# Patient Record
Sex: Male | Born: 1970 | Race: Black or African American | Hispanic: No | Marital: Single | Smoking: Current every day smoker
Health system: Southern US, Community
[De-identification: ages and names within clinical notes are randomized; demographics above are authoritative.]

## PROBLEM LIST (undated history)

## (undated) DIAGNOSIS — E785 Hyperlipidemia, unspecified: Secondary | ICD-10-CM

## (undated) DIAGNOSIS — B009 Herpesviral infection, unspecified: Secondary | ICD-10-CM

## (undated) DIAGNOSIS — L509 Urticaria, unspecified: Secondary | ICD-10-CM

## (undated) DIAGNOSIS — I10 Essential (primary) hypertension: Secondary | ICD-10-CM

## (undated) HISTORY — DX: Urticaria, unspecified: L50.9

## (undated) HISTORY — DX: Hyperlipidemia, unspecified: E78.5

## (undated) HISTORY — DX: Herpesviral infection, unspecified: B00.9

## (undated) HISTORY — PX: APPENDECTOMY: SHX54

## (undated) HISTORY — DX: Essential (primary) hypertension: I10

---

## 1998-10-15 ENCOUNTER — Emergency Department (HOSPITAL_COMMUNITY): Admission: EM | Admit: 1998-10-15 | Discharge: 1998-10-15 | Payer: Self-pay

## 2000-03-31 ENCOUNTER — Emergency Department (HOSPITAL_COMMUNITY): Admission: EM | Admit: 2000-03-31 | Discharge: 2000-03-31 | Payer: Self-pay | Admitting: Emergency Medicine

## 2000-03-31 ENCOUNTER — Encounter: Payer: Self-pay | Admitting: Emergency Medicine

## 2002-06-23 ENCOUNTER — Emergency Department (HOSPITAL_COMMUNITY): Admission: EM | Admit: 2002-06-23 | Discharge: 2002-06-23 | Payer: Self-pay | Admitting: Emergency Medicine

## 2002-08-25 ENCOUNTER — Emergency Department (HOSPITAL_COMMUNITY): Admission: EM | Admit: 2002-08-25 | Discharge: 2002-08-25 | Payer: Self-pay | Admitting: Emergency Medicine

## 2004-01-12 ENCOUNTER — Ambulatory Visit (HOSPITAL_COMMUNITY): Admission: EM | Admit: 2004-01-12 | Discharge: 2004-01-12 | Payer: Self-pay | Admitting: Emergency Medicine

## 2006-10-12 ENCOUNTER — Emergency Department (HOSPITAL_COMMUNITY): Admission: EM | Admit: 2006-10-12 | Discharge: 2006-10-12 | Payer: Self-pay | Admitting: Emergency Medicine

## 2007-11-15 ENCOUNTER — Ambulatory Visit (HOSPITAL_BASED_OUTPATIENT_CLINIC_OR_DEPARTMENT_OTHER): Admission: RE | Admit: 2007-11-15 | Discharge: 2007-11-15 | Payer: Self-pay | Admitting: Orthopaedic Surgery

## 2008-02-14 HISTORY — PX: ANKLE SURGERY: SHX546

## 2008-11-08 ENCOUNTER — Encounter: Admission: RE | Admit: 2008-11-08 | Discharge: 2008-11-08 | Payer: Self-pay | Admitting: Family Medicine

## 2008-11-08 ENCOUNTER — Encounter: Payer: Self-pay | Admitting: Internal Medicine

## 2008-12-20 ENCOUNTER — Ambulatory Visit: Payer: Self-pay | Admitting: Internal Medicine

## 2008-12-20 DIAGNOSIS — I10 Essential (primary) hypertension: Secondary | ICD-10-CM | POA: Insufficient documentation

## 2008-12-20 DIAGNOSIS — J984 Other disorders of lung: Secondary | ICD-10-CM

## 2008-12-26 ENCOUNTER — Ambulatory Visit (HOSPITAL_COMMUNITY): Admission: RE | Admit: 2008-12-26 | Discharge: 2008-12-26 | Payer: Self-pay | Admitting: Internal Medicine

## 2009-01-04 ENCOUNTER — Ambulatory Visit: Payer: Self-pay | Admitting: Internal Medicine

## 2009-01-14 ENCOUNTER — Ambulatory Visit: Payer: Self-pay | Admitting: Internal Medicine

## 2009-07-17 ENCOUNTER — Telehealth: Payer: Self-pay | Admitting: Internal Medicine

## 2010-09-28 ENCOUNTER — Encounter: Payer: Self-pay | Admitting: Internal Medicine

## 2011-01-20 NOTE — Op Note (Signed)
NAMECARLESS, SLATTEN NO.:  [id number]   MEDICAL RECORD NO.:  [id number]          PATIENT TYPE:  AMB   LOCATION:  DSC                          FACILITY:  [facility]   PHYSICIAN:  Lubertha Basque. Dalldorf, M.D.DATE OF BIRTH:  May 21, 1971   DATE OF PROCEDURE:  11/15/2007  DATE OF DISCHARGE:                               OPERATIVE REPORT   PREOPERATIVE DIAGNOSES:  1. Right ankle retained hardware.  2. Left great ingrown toenail.   POSTOPERATIVE DIAGNOSES:  1. Right ankle retained hardware.  2. Left great ingrown toenail.   PROCEDURES:  1. Right ankle medial and lateral hardware removal.  2. Left great toe nail partial ablation, medial and lateral.   ANESTHESIA:  General.   ATTENDING SURGEON:  Lubertha Basque. Jerl Santos, M.D.   ASSISTANT:  Lindwood Qua, P.A.   INDICATIONS FOR PROCEDURE:  The patient is a 40 year old man about 5  years from ankle ORIF on the right after a horse related accident.  This  has healed well but he has been left with some prominent hardware which  bothers him on the medial and lateral aspects.  He wishes to have this  removed.  He also has a significantly ingrown toenail on the opposite  foot and he wishes to have this addressed with partial medial and  lateral nailbed ablation.  He has had this trimmed in the past and is  actually status post successful right great toenail ablation also  partial in nature.  Informed operative consent was obtained after  discussion of possible complications of reaction to anesthesia,  infection, repeat fracture on the right and repeat ingrown toenail on  the left.   SUMMARY OF FINDINGS AND PROCEDURE:  Under general anesthesia through two  separate incisions, we removed hardware from the right ankle.  On the  medial aspect, we removed two malleolar screws.  On the lateral aspect  removed six screws and the side plate.  Fluoroscopy was used to confirm  removal of all hardware and I read these views myself.  On the  opposite  foot we performed medial and lateral aspect partial nailbed ablation  with removal of the nail.  We used phenol  to hopefully permanently  eliminate the medial and lateral aspects of the nail leaving the central  portion intact.   DESCRIPTION OF PROCEDURE:  The patient was taken to the operating suite  where general anesthetic applied without difficulty.  He was positioned  supine and prepped and draped in normal sterile fashion.  After  administration of IV Kefzol the right leg was elevated, exsanguinated,  tourniquet inflated about the calf.  We made two small incisions on the  medial aspect over his palpable screw heads.  We dissected down to the  screws and removed them with a small fragment set screwdriver.  On the  lateral aspect I used most of his old lateral incision with dissection  down to the plate.  I removed six screws along with the side plate.  Fluoroscopy was then used to confirm adequate removal all hardware.  Wounds were irrigated and reapproximated deep tissues with 2-0  undyed  Vicryl and skin with staples.  Some Marcaine was injected about both  incision sites followed by Adaptic and dry gauze dressing with a  posterior splint of plaster with ankle in neutral position.  Prior to  placement of the plaster splint on the right and while the field was  still sterile, we addressed his left great toenail.  I used a Freer and  dissected down medial lateral aspects where he was significantly  ingrown.  We then took some scissors and removed the medial and lateral  borders of the nail up through the nailbed.  I used a hemostat to abrade  the nailbed both medial and lateral and then used phenol on a Q-tip on  both aspects for a couple of minutes to hopefully perform permanent  nailbed ablation medial and lateral leaving 60 or 70% of the nail  centrally intact.  Some Xeroform and Adaptic were applied followed by  dry gauze and loose wrap.  Estimated blood loss and  intraoperative  fluids can be obtained from anesthesia records as can accurate  tourniquet time on the right.   DISPOSITION:  The patient was extubated in the operating room and taken  to recovery in stable addition.  He was to go home the same day and  follow up in the office in less than a week.  I will contact him by  phone tonight.      Lubertha Basque Jerl Santos, M.D.  Electronically Signed     PGD/MEDQ  D:  11/15/2007  T:  11/16/2007  Job:  [id number]

## 2011-01-23 NOTE — Op Note (Signed)
NAME:  Martin [name], Martin [name]                          ACCOUNT NO.:  [id number]   MEDICAL RECORD NO.:  [id number]                   PATIENT TYPE:  EMS   LOCATION:  ED                                   FACILITY:  [facility]   PHYSICIAN:  Lubertha Basque. Jerl Santos, M.D.             DATE OF BIRTH:  29-Dec-1970   DATE OF PROCEDURE:  01/12/2004  DATE OF DISCHARGE:                                 OPERATIVE REPORT   PREOPERATIVE DIAGNOSIS:  Right ankle fracture.   POSTOPERATIVE DIAGNOSIS:  Right ankle fracture.   PROCEDURE:  ORIF right ankle trimalleolar fracture.   ANESTHESIA:  General.   ATTENDING SURGEON:  Lubertha Basque. Jerl Santos, M.D.   ASSISTANT:  Prince Rome, P.A.   INDICATION FOR PROCEDURE:  The patient is a 40 year old man, who suffered a  horse injury this afternoon.  He was left with a trimalleolar ankle fracture  with a fairly high fibula Weber C variety.  He was offered operative  intervention in hopes of realigning his joint in a more anatomic fashion and  minimizing the chance of ankle trouble in the future.  Informed operative  consent was obtained after discussion of possible complications of reaction  to anesthesia, infection, ankle stiffness, and need for more surgery.  Possibly neurovascular compromise was also discussed.   DESCRIPTION OF PROCEDURE:  The patient was taken to the operating suite  where general anesthetic was applied without difficulty.  He was positioned  supine with a bump under the right hip and prepped and draped in normal  sterile fashion.  After the administration of preop IV antibiotics, the  right leg was elevated, exsanguinated, and a tourniquet inflated about the  upper calf.  A longitudinal incision was made laterally over the fibula  centered with the fracture site.  Dissection was carried down to this  fracture site which was fairly difficult to reduce.  I then approached the  medial aspect, again through a longitudinal incision centered over the  fracture  site.  I was able to reduce this in an anatomic fashion after  retrieving soft tissues from the fracture site and lipping the fragment  about 90 degrees.  We then stabilized it with 2 malleolar screws which were  partially threaded cancellous screws __________ 45 mm in length both.  Fluoroscopy was used to confirm adequate placement of this hardware.  The  fibula then reduced in an easier fashion.  This was then stabilized with a  DCP plate, 6 hole variety with all 6 holes filled with fully threaded  cancellous screws, achieving bicortical purchase of each.  We placed the  compression through the fracture site with a DCP plate.  Fluoroscopy was  again used to confirm adequate placement of hardware and reduction of the  fractures.  I then examined the ankle fluoroscopically to test the integrity  of the syndesmosis.  The syndesmosis seemed to be intact when stressed in  both  directions.  As a result, no syndesmotic screw was felt to be  necessary.  The wounds were irrigated followed by reapproximation of  subcutaneous tissues with 0 and 2-0 undyed Vicryl and skin with staples.  Marcaine was injected about the incision site.  The tourniquet was deflated,  and the foot became pink and warm immediately.  Adaptic was placed over the  wounds followed by dry gauze and a posterior splint of plaster with the  ankle in neutral position.  Estimated blood loss and intraoperative fluids  as well as accurate tourniquet time can be obtained from the anesthesia  records.   DISPOSITION:  The patient was extubated in the operating room and taken to  the recovery room in stable condition.  Plans were for him to go home same  day and to follow up in the office in less than a week.  I will try to  contact him tomorrow.                                               Lubertha Basque Jerl Santos, M.D.    PGD/MEDQ  D:  01/12/2004  T:  01/13/2004  Job:  [id number]

## 2011-01-23 NOTE — Letter (Signed)
July 17, 2009    Sanjuan Dame  [address]  [place], [place]  [address]   RE:  KAIYU, MIRABAL  MRN:  [id number]  /  DOB:  04/25/1971   Dear Mr. Demir:   I understand that you had cancelled your followup CT scan of the chest  and your followup office visit here at [facility].  I had  reviewed your previous x-rays and related studies with you when you were  here on Jan 14, 2009.  These had shown lung nodules with no way to  guarantee that there is not a cancer.  Hopefully, these are innocent  scars, but we have no way of knowing unless we can watch them over time  to see if they grow.  I would be happy to discuss your situation again,  but hope you will get yourself followed up somewhere else if not here.   Please let me know if I can explain or discuss options with you.    Sincerely,      Clinton D. Maple [name], MD, Tonny Bollman, FACP  Electronically Signed    CDY/MedQ  DD: 07/17/2009  DT: 07/18/2009  Job #: [id number]

## 2011-10-13 ENCOUNTER — Other Ambulatory Visit: Payer: Self-pay | Admitting: Family Medicine

## 2011-10-15 NOTE — Telephone Encounter (Signed)
PLEASE PULL CHART AND PUT IN REFILL REQUEST. THANKS

## 2011-10-16 ENCOUNTER — Ambulatory Visit (INDEPENDENT_AMBULATORY_CARE_PROVIDER_SITE_OTHER): Payer: BC Managed Care – PPO | Admitting: Emergency Medicine

## 2011-10-16 DIAGNOSIS — R911 Solitary pulmonary nodule: Secondary | ICD-10-CM

## 2011-10-16 DIAGNOSIS — B009 Herpesviral infection, unspecified: Secondary | ICD-10-CM

## 2011-10-16 DIAGNOSIS — J984 Other disorders of lung: Secondary | ICD-10-CM

## 2011-10-16 DIAGNOSIS — A6001 Herpesviral infection of penis: Secondary | ICD-10-CM

## 2011-10-16 MED ORDER — VALACYCLOVIR HCL 1 G PO TABS: 1000.0000 mg | ORAL_TABLET | Freq: Every day | ORAL | Status: DC

## 2011-10-16 NOTE — Progress Notes (Signed)
  Subjective:    Patient ID: Martin [name], male    DOB: 1971-02-27, 41 y.o.   MRN: [id number]  HPI patient enters for refill of his Valtrex. He is doing well since starting this medication as a preventative. His had no outbreak since starting it.    Review of Systems he has no other complaints at this time     Objective:   Physical Exam  No active lesions at this time      Assessment & Plan:  We'll refill his medications for Valtrex 1 g to take one a day prophylactically. We did discuss also that he has a history of a pulmonary nodule that was followed by Dr. Maple [name] and he has not had a followup visit for 3 years. He is agreeable to make an appointment to see him for followup

## 2011-10-16 NOTE — Patient Instructions (Addendum)
Please make appt with dr. Trixie Dredge Herpes Genital herpes is a sexually transmitted disease. This means that it is a disease passed by having sex with an infected person. There is no cure for genital herpes. The time between attacks can be months to years. The virus may live in a person but produce no problems (symptoms). This infection can be passed to a baby as it travels down the birth canal (vagina). In a newborn, this can cause central nervous system damage, eye damage, or even death. The virus that causes genital herpes is usually HSV-2 virus. The virus that causes oral herpes is usually HSV-1. The diagnosis (learning what is wrong) is made through culture results. SYMPTOMS  Usually symptoms of pain and itching begin a few days to a week after contact. It first appears as small blisters that progress to small painful ulcers which then scab over and heal after several days. It affects the outer genitalia, birth canal, cervix, penis, anal area, buttocks, and thighs. HOME CARE INSTRUCTIONS   Keep ulcerated areas dry and clean.   Take medications as directed. Antiviral medications can speed up healing. They will not prevent recurrences or cure this infection. These medications can also be taken for suppression if there are frequent recurrences.   While the infection is active, it is contagious. Avoid all sexual contact during active infections.   Condoms may help prevent spread of the herpes virus.   Practice safe sex.   Wash your hands thoroughly after touching the genital area.   Avoid touching your eyes after touching your genital area.   Inform your caregiver if you have had genital herpes and become pregnant. It is your responsibility to insure a safe outcome for your baby in this pregnancy.   Only take over-the-counter or prescription medicines for pain, discomfort, or fever as directed by your caregiver.  SEEK MEDICAL CARE IF:   You have a recurrence of this infection.   You do  not respond to medications and are not improving.   You have new sources of pain or discharge which have changed from the original infection.   You have an oral temperature above 102 F (38.9 C).   You develop abdominal pain.   You develop eye pain or signs of eye infection.  Document Released: 08/21/2000 Document Revised: 05/06/2011 Document Reviewed: 09/11/2009 [facility] Patient Information 2012 [place], [place].

## 2012-05-31 ENCOUNTER — Encounter (INDEPENDENT_AMBULATORY_CARE_PROVIDER_SITE_OTHER): Payer: Self-pay | Admitting: Surgery

## 2012-05-31 ENCOUNTER — Ambulatory Visit (INDEPENDENT_AMBULATORY_CARE_PROVIDER_SITE_OTHER): Payer: BC Managed Care – PPO | Admitting: Surgery

## 2012-05-31 VITALS — BP 132/80 | HR 76 | Temp 96.8°F | Resp 18 | Ht 72.0 in | Wt 246.4 lb

## 2012-05-31 DIAGNOSIS — R229 Localized swelling, mass and lump, unspecified: Secondary | ICD-10-CM

## 2012-05-31 DIAGNOSIS — R220 Localized swelling, mass and lump, head: Secondary | ICD-10-CM | POA: Insufficient documentation

## 2012-05-31 NOTE — Progress Notes (Signed)
Patient ID: Martin Martin [name], male   DOB: 04/01/1971, 41 y.o.   MRN: [id number]  Chief Complaint  Patient presents with  . New Evaluation    cyst on scalp    HPI Martin Martin [name] is Martin [name] 41 y.o. male.   HPIThis gentleman is Martin [name] self-referral for evaluation of Martin [name] posterior scalp mass. It has been present for approximately Martin [name] year and is now getting larger and causing to have pain including headaches. He also has Martin [name] chronic sebaceous cyst on his back which occasionally drains. He reports the mass was has never drained.  History reviewed. No pertinent past medical history.  History reviewed. No pertinent past surgical history.  History reviewed. No pertinent family history.  Social History History  Substance Use Topics  . Smoking status: Current Every Day Smoker    Types: Cigarettes  . Smokeless tobacco: Not on file  . Alcohol Use: Not on file    No Known Allergies  Current Outpatient Prescriptions  Medication Sig Dispense Refill  . lisinopril-hydrochlorothiazide (PRINZIDE,ZESTORETIC) 10-12.5 MG per tablet Take 1 tablet by mouth daily.      . valACYclovir (VALTREX) 1000 MG tablet Take 1 tablet (1,000 mg total) by mouth daily.  90 tablet  3    Review of Systems Review of Systems  Constitutional: Negative for fever, chills and unexpected weight change.  HENT: Negative for hearing loss, congestion, sore throat, trouble swallowing and voice change.   Eyes: Negative for visual disturbance.  Respiratory: Negative for cough and wheezing.   Cardiovascular: Negative for chest pain, palpitations and leg swelling.  Gastrointestinal: Negative for nausea, vomiting, abdominal pain, diarrhea, constipation, blood in stool, abdominal distention, anal bleeding and rectal pain.  Genitourinary: Negative for hematuria and difficulty urinating.  Musculoskeletal: Negative for arthralgias.  Skin: Negative for rash and wound.  Neurological: Negative for seizures, syncope, weakness and headaches.    Hematological: Negative for adenopathy. Does not bruise/bleed easily.  Psychiatric/Behavioral: Negative for confusion.    Blood pressure 132/80, pulse 76, temperature 96.8 F (36 C), temperature source Temporal, resp. rate 18, height 6' (1.829 m), weight 246 lb 6.4 oz (111.766 kg), SpO2 99.00%.  Physical Exam Physical Exam  Constitutional: He is oriented to person, place, and time. He appears well-developed and well-nourished. No distress.  HENT:  Head: Normocephalic and atraumatic.  Right Ear: External ear normal.  Left Ear: External ear normal.  Nose: Nose normal.  Mouth/Throat: Oropharynx is clear and moist.       There is Martin [name] 2 cm mass on his right posterior scalp just above the neck. It is soft and slightly mobile   Eyes: Conjunctivae normal and EOM are normal. Pupils are equal, round, and reactive to light. Right eye exhibits no discharge. Left eye exhibits no discharge. No scleral icterus.  Neck: Normal range of motion. Neck supple. No JVD present. No tracheal deviation present. No thyromegaly present.  Cardiovascular: Normal rate, regular rhythm, normal heart sounds and intact distal pulses.   No murmur heard. Pulmonary/Chest: Effort normal and breath sounds normal. No respiratory distress. He has no wheezes.       There is Martin [name] one severe sebaceous cyst on his upper back in the midline. There is chronic skin changes consistent with previous infection  Lymphadenopathy:    He has no cervical adenopathy.  Neurological: He is alert and oriented to person, place, and time.  Skin: Skin is warm and dry. He is not diaphoretic.  Psychiatric: His behavior is normal.    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 I've discussed this with him in detail. I've discussed the  risks of surgery which includes but is not limited to bleeding, infection, injury to surrounding structures, recurrence, etc. He understands and wishes to proceed. Surgery will be scheduled. Likelihood of success is good       Martin Martin [name] 05/31/2012, 11:01 AM

## 2012-10-06 ENCOUNTER — Ambulatory Visit (INDEPENDENT_AMBULATORY_CARE_PROVIDER_SITE_OTHER): Payer: BC Managed Care – PPO | Admitting: Emergency Medicine

## 2012-10-06 VITALS — BP 148/91 | HR 92 | Temp 99.7°F | Resp 18 | Ht 72.0 in | Wt 238.0 lb

## 2012-10-06 DIAGNOSIS — J039 Acute tonsillitis, unspecified: Secondary | ICD-10-CM

## 2012-10-06 MED ORDER — CEFPROZIL 500 MG PO TABS: 500.0000 mg | ORAL_TABLET | Freq: Two times a day (BID) | ORAL | Status: AC

## 2012-10-06 MED ORDER — HYDROCODONE-ACETAMINOPHEN 5-325 MG PO TABS: 1.0000 | ORAL_TABLET | ORAL | Status: DC | PRN

## 2012-10-06 NOTE — Progress Notes (Signed)
[facility] [address], [place] [address] [phone]  Date:  10/06/2012   Name:  TAMIKA NOU   DOB:  03-23-1971   MRN:  [id number]  PCP:  Provider Not In System    Chief Complaint: Chills, Sore Throat, Dizziness, Headache and Otalgia   History of Present Illness:  CHUCK CABAN is a 42 y.o. very pleasant male patient who presents with the following:  Sore throat and fever and chills past two days with "white spots" in his throat.  Hoarse.  Malaise. Myalgias, arthralgias.  No cough or coryza.  No ill contacts.  Patient Active Problem List  Diagnosis  . HYPERTENSION  . LUNG NODULE  . HSV-2 (herpes simplex virus 2) infection  . Scalp mass    History reviewed. No pertinent past medical history.  History reviewed. No pertinent past surgical history.  History  Substance Use Topics  . Smoking status: Current Every Day Smoker    Types: Cigarettes  . Smokeless tobacco: Not on file  . Alcohol Use: Not on file    History reviewed. No pertinent family history.  No Known Allergies  Medication list has been reviewed and updated.  Current Outpatient Prescriptions on File Prior to Visit  Medication Sig Dispense Refill  . lisinopril-hydrochlorothiazide (PRINZIDE,ZESTORETIC) 10-12.5 MG per tablet Take 1 tablet by mouth daily.      . valACYclovir (VALTREX) 1000 MG tablet Take 1 tablet (1,000 mg total) by mouth daily.  90 tablet  3    Review of Systems:  As per HPI, otherwise negative.    Physical Examination: Filed Vitals:   10/06/12 1335  BP: 148/91  Pulse: 92  Temp: 99.7 F (37.6 C)  Resp: 18   Filed Vitals:   10/06/12 1335  Height: 6' (1.829 m)  Weight: 238 lb (107.956 kg)   Body mass index is 32.28 kg/(m^2). Ideal Body Weight: Weight in (lb) to have BMI = 25: 183.9   GEN: WDWN, NAD, Non-toxic, A & O x 3 HEENT: Atraumatic, Normocephalic. Neck supple. No masses, anterior cervical lymphadenopathy.  Exudative tonsilitis Ears and  Nose: No external deformity. CV: RRR, No M/G/R. No JVD. No thrill. No extra heart sounds. PULM: CTA B, no wheezes, crackles, rhonchi. No retractions. No resp. distress. No accessory muscle use. ABD: S, NT, ND, +BS. No rebound. No HSM. EXTR: No c/c/e NEURO Normal gait.  PSYCH: Normally interactive. Conversant. Not depressed or anxious appearing.  Calm demeanor.    Assessment and Plan: Tonsillitis cefzil Hydrocodone Follow up as needed  Carmelina Dane, MD

## 2012-10-06 NOTE — Patient Instructions (Signed)
Tonsillitis Tonsils are lumps of lymphoid tissues at the back of the throat. Each tonsil has 20 crevices (crypts). Tonsils help fight nose and throat infections and keep infection from spreading to other parts of the body for the first 18 months of life. Tonsillitis is an infection of the throat that causes the tonsils to become red, tender, and swollen. CAUSES Sudden and, if treated, temporary (acute) tonsillitis is usually caused by infection with streptococcal bacteria. Long lasting (chronic) tonsillitis occurs when the crypts of the tonsils become filled with pieces of food and bacteria, which makes it easy for the tonsils to become constantly infected. SYMPTOMS  Symptoms of tonsillitis include:  A sore throat.  White patches on the tonsils.  Fever.  Tiredness. DIAGNOSIS Tonsillitis can be diagnosed through a physical exam. Diagnosis can be confirmed with the results of lab tests, including a throat culture. TREATMENT  The goals of tonsillitis treatment include the reduction of the severity and duration of symptoms, prevention of associated conditions, and prevention of disease transmission. Tonsillitis caused by bacteria can be treated with antibiotics. Usually, treatment with antibiotics is started before the cause of the tonsillitis is known. However, if it is determined that the cause is not bacterial, antibiotics will not treat the tonsillitis. If attacks of tonsillitis are severe and frequent, your caregiver may recommend surgery to remove the tonsils (tonsillectomy). HOME CARE INSTRUCTIONS   Rest as much as possible and get plenty of sleep.  Drink plenty of fluids. While the throat is very sore, eat soft foods or liquids, such as sherbet, soups, or instant breakfast drinks.  Eat frozen ice pops.  Older children and adults may gargle with a warm or cold liquid to help soothe the throat. Mix 1 teaspoon of salt in 1 cup of water.  Other family members who also develop a sore  throat or fever should have a medical exam or throat culture.  Only take over-the-counter or prescription medicines for pain, discomfort, or fever as directed by your caregiver.  If you are given antibiotics, take them as directed. Finish them even if you start to feel better. SEEK MEDICAL CARE IF:   Your baby is older than 3 months with a rectal temperature of 100.5 F (38.1 C) or higher for more than 1 day.  Large, tender lumps develop in your neck.  A rash develops.  Green, yellow-brown, or bloody substance is coughed up.  You are unable to swallow liquids or food for 24 hours.  Your child is unable to swallow food or liquids for 12 hours. SEEK IMMEDIATE MEDICAL CARE IF:   You develop any new symptoms such as vomiting, severe headache, stiff neck, chest pain, or trouble breathing or swallowing.  You have severe throat pain along with drooling or voice changes.  You have severe pain, unrelieved with recommended medications.  You are unable to fully open the mouth.  You develop redness, swelling, or severe pain anywhere in the neck.  You have a fever.  Your baby is older than 3 months with a rectal temperature of 102 F (38.9 C) or higher.  Your baby is 3 months old or younger with a rectal temperature of 100.4 F (38 C) or higher. MAKE SURE YOU:   Understand these instructions.  Will watch your condition.  Will get help right away if you are not doing well or get worse. Document Released: 06/03/2005 Document Revised: 11/16/2011 Document Reviewed: 10/30/2010 [employer] Patient Information 2013 [employer].  

## 2012-12-07 ENCOUNTER — Ambulatory Visit: Payer: BC Managed Care – PPO

## 2012-12-07 ENCOUNTER — Ambulatory Visit (INDEPENDENT_AMBULATORY_CARE_PROVIDER_SITE_OTHER): Payer: BC Managed Care – PPO | Admitting: Family Medicine

## 2012-12-07 VITALS — BP 144/92 | HR 92 | Temp 98.5°F | Resp 17 | Ht 71.0 in | Wt 238.0 lb

## 2012-12-07 DIAGNOSIS — R911 Solitary pulmonary nodule: Secondary | ICD-10-CM

## 2012-12-07 DIAGNOSIS — R6883 Chills (without fever): Secondary | ICD-10-CM

## 2012-12-07 DIAGNOSIS — B009 Herpesviral infection, unspecified: Secondary | ICD-10-CM

## 2012-12-07 DIAGNOSIS — R5381 Other malaise: Secondary | ICD-10-CM

## 2012-12-07 DIAGNOSIS — R1012 Left upper quadrant pain: Secondary | ICD-10-CM

## 2012-12-07 DIAGNOSIS — W57XXXA Bitten or stung by nonvenomous insect and other nonvenomous arthropods, initial encounter: Secondary | ICD-10-CM

## 2012-12-07 DIAGNOSIS — R05 Cough: Secondary | ICD-10-CM

## 2012-12-07 DIAGNOSIS — T148 Other injury of unspecified body region: Secondary | ICD-10-CM

## 2012-12-07 DIAGNOSIS — R61 Generalized hyperhidrosis: Secondary | ICD-10-CM

## 2012-12-07 DIAGNOSIS — R5383 Other fatigue: Secondary | ICD-10-CM

## 2012-12-07 DIAGNOSIS — R3129 Other microscopic hematuria: Secondary | ICD-10-CM

## 2012-12-07 LAB — POCT CBC
Hemoglobin: 14 g/dL — AB (ref 14.1–18.1)
Lymph, poc: 1.9 (ref 0.6–3.4)
MCH, POC: 28.8 pg (ref 27–31.2)
MCHC: 31.6 g/dL — AB (ref 31.8–35.4)
MID (cbc): 0.6 (ref 0–0.9)
MPV: 8.2 fL (ref 0–99.8)
POC Granulocyte: 1.3 — AB (ref 2–6.9)
POC LYMPH PERCENT: 51 %L — AB (ref 10–50)
POC MID %: 14.5 %M — AB (ref 0–12)
Platelet Count, POC: 186 10*3/uL (ref 142–424)
RDW, POC: 15.1 %
WBC: 3.8 10*3/uL — AB (ref 4.6–10.2)

## 2012-12-07 LAB — POCT URINALYSIS DIPSTICK
Bilirubin, UA: NEGATIVE
Ketones, UA: NEGATIVE
Leukocytes, UA: NEGATIVE
Spec Grav, UA: >=1.03
pH, UA: 5.5

## 2012-12-07 LAB — POCT UA - MICROSCOPIC ONLY

## 2012-12-07 MED ORDER — DOXYCYCLINE HYCLATE 100 MG PO TABS: 100.0000 mg | ORAL_TABLET | Freq: Two times a day (BID) | ORAL | Status: DC

## 2012-12-07 MED ORDER — VALACYCLOVIR HCL 1 G PO TABS: 1000.0000 mg | ORAL_TABLET | Freq: Every day | ORAL | Status: DC

## 2012-12-07 MED ORDER — HYDROCODONE-HOMATROPINE 5-1.5 MG/5ML PO SYRP: 5.0000 mL | ORAL_SOLUTION | Freq: Three times a day (TID) | ORAL | Status: DC | PRN

## 2012-12-07 NOTE — Progress Notes (Signed)
[facility] [address] [place] [address] [phone]  Date:  12/07/2012   Name:  Martin [name]   DOB:  April 08, 1971   MRN:  [id number]  PCP:  Provider Not In System    Chief Complaint: Chills, Nasal Congestion, Generalized Body Aches, Tick Removal and Night Sweats   History of Present Illness:  Martin [name] is a 42 y.o. very pleasant male patient who presents with the following:  He is here today with illness.  He has noted night sweats, aching all over (this is now better), cough more at night when he lies down. He noted onset of the sweats and other symptoms about 2 days ago.   He did have some diarrhea 4 days ago, before the other symptoms set in.  He has noted chills, but has not measured a fever.  Felt feverish subjectively No nausea or vomiting His nose is congested.    He has been taking left over hydrocodone for his symptoms, as well as nyquil.    About 2 weeks ago he did have a tick on his left leg.  The tick was not yet swollen up with blood.     He thinks it was a dog tick.    He also describes some left flank/ LUQ pain which is not severe, and which has been intermittent over the last several weeks.  He notes the abdominal pain more when he lies down.    Looking back in his chart, he had a pulmonary nodule which was never fully followed up.  CT chest 2010: IMPRESSION:  1. Left lower lobe lung nodule measuring 1.2 cm. This does not  have imaging characteristics to confirm a benign granuloma or  hamartoma. Therefore, considerations include early bronchogenic  carcinoma or post infectious/inflammatory nodule. Consider further  evaluation with CT PET. If this is not performed, short-term  follow-up with chest CT at 3 months is recommended.  2. A possible 7 mm central left upper lobe lung nodule (versus  branching vessel). This could be reevaluated on follow-up PET or  CT. If follow-up CT is performed, consider contrast enhanced study  to  differentiate a nodule from branching vessel.  3. Other scattered tiny lung nodules are indeterminate.   He did have a PET scan after the CT in April 2010: IMPRESSION:  1. The 11 mm left lower lobe nodule demonstrates no abnormal  metabolic activity, compatible with a probable benign finding.  2. The central left upper lobe approximately 7 mm nodule is mildly  hypermetabolic. This is a nonspecific finding and could reflect a  noncalcified granuloma but neoplasm is not excluded.  3. Borderline hypermetabolic left hilar nodal activity. No other  abnormal nodal metabolic activity.  Based on size and location of the upper lobe nodule, biopsy is  unlikely to be possible at this time. Follow-up chest CT with  contrast in 3 months is recommended. This recommendation follows  the consensus statement: Guidelines for Management of Small  Pulmonary Nodules Detected on CT Scans: A Statement from the  Fleischner Society as published in Radiology 2005; 237:395-400.  Online at: DietDisorder.cz.  He did not have the follow- up CT as mentioned above  Patient Active Problem List  Diagnosis  . HYPERTENSION  . LUNG NODULE  . HSV-2 (herpes simplex virus 2) infection  . Scalp mass    History reviewed. No pertinent past medical history.  History reviewed. No pertinent past surgical history.  History  Substance Use Topics  .  Smoking status: Current Every Day Smoker -- 1.00 packs/day for 3 years    Types: Cigarettes  . Smokeless tobacco: Not on file  . Alcohol Use: Yes    History reviewed. No pertinent family history.  No Known Allergies  Medication list has been reviewed and updated.  Current Outpatient Prescriptions on File Prior to Visit  Medication Sig Dispense Refill  . HYDROcodone-acetaminophen (NORCO) 5-325 MG per tablet Take 1 tablet by mouth every 4 (four) hours as needed for pain.  30 tablet  0  . lisinopril-hydrochlorothiazide  (PRINZIDE,ZESTORETIC) 10-12.5 MG per tablet Take 1 tablet by mouth daily.      . valACYclovir (VALTREX) 1000 MG tablet Take 1 tablet (1,000 mg total) by mouth daily.  90 tablet  3   No current facility-administered medications on file prior to visit.    Review of Systems:  As per HPI- otherwise negative.   Physical Examination: Filed Vitals:   12/07/12 0950  BP: 144/92  Pulse: 92  Temp: 98.5 F (36.9 C)  Resp: 17   Filed Vitals:   12/07/12 0950  Height: 5\' 11"  (1.803 m)  Weight: 238 lb (107.956 kg)   Body mass index is 33.21 kg/(m^2). Ideal Body Weight: Weight in (lb) to have BMI = 25: 178.9  GEN: WDWN, NAD, Non-toxic, A & O x 3, overweight  HEENT: Atraumatic, Normocephalic. Neck supple. No masses, No LAD.  Bilateral TM wnl, oropharynx normal.  PEERL,EOMI.   Ears and Nose: No external deformity. CV: RRR, No M/G/R. No JVD. No thrill. No extra heart sounds. PULM: CTA B, no rhonchi. No retractions. No resp. distress. No accessory muscle use.  Minimal congestion and wheeze ascultated ABD: S, ND, +BS. No rebound. No HSM.  He has slight tenderness in his left upper quadrant and left flank, no rash or redness  EXTR: No c/c/e NEURO Normal gait.  PSYCH: Normally interactive. Conversant. Not depressed or anxious appearing.  Calm demeanor.  No rash, checked palms and soles - negative Inspected site of tick bite on the back of his right leg- no rash visible  [facility] reading (PRIMARY) by  Dr. Patsy Lager. CXR: stable elevation of right hemi- diaphragm, possible right lower lobe atelectasis vs consolidation.   Clinical Data: Night sweats.  CHEST - 2 VIEW  Comparison: 08/08/2008  Findings: Mild peribronchial thickening. Heart is normal size. No confluent airspace opacities or effusions. No acute bony abnormality.  IMPRESSION: Mild bronchitic changes.   Results for orders placed in visit on 12/07/12  POCT CBC      Result Value Range   WBC 3.8 (*) 4.6 - 10.2 K/uL   Lymph, poc 1.9   0.6 - 3.4   POC LYMPH PERCENT 51.0 (*) 10 - 50 %L   MID (cbc) 0.6  0 - 0.9   POC MID % 14.5 (*) 0 - 12 %M   POC Granulocyte 1.3 (*) 2 - 6.9   Granulocyte percent 34.5 (*) 37 - 80 %G   RBC 4.86  4.69 - 6.13 M/uL   Hemoglobin 14.0 (*) 14.1 - 18.1 g/dL   HCT, POC 16.1  09.6 - 53.7 %   MCV 91.2  80 - 97 fL   MCH, POC 28.8  27 - 31.2 pg   MCHC 31.6 (*) 31.8 - 35.4 g/dL   RDW, POC 04.5     Platelet Count, POC 186  142 - 424 K/uL   MPV 8.2  0 - 99.8 fL  POCT UA - MICROSCOPIC ONLY  Result Value Range   WBC, Ur, HPF, POC 1-2     RBC, urine, microscopic 4-6     Bacteria, U Microscopic small     Mucus, UA 2+     Epithelial cells, urine per micros 2-4     Crystals, Ur, HPF, POC neg     Casts, Ur, LPF, POC neg     Yeast, UA neg    POCT URINALYSIS DIPSTICK      Result Value Range   Color, UA yellow     Clarity, UA clear     Glucose, UA neg     Bilirubin, UA neg     Ketones, UA neg     Spec Grav, UA >=1.030     Blood, UA small     pH, UA 5.5     Protein, UA trace     Urobilinogen, UA 0.2     Nitrite, UA neg     Leukocytes, UA Negative       Assessment and Plan: Lung nodule seen on imaging study - Plan: DG Chest 2 View  Other malaise and fatigue  Night sweats - Plan: Comprehensive metabolic panel, HIV antibody  Chills - Plan: POCT CBC  LUQ pain - Plan: Amylase, Lipase, POCT UA - Microscopic Only, POCT urinalysis dipstick, Urine culture  Tick bite - Plan: doxycycline (VIBRA-TABS) 100 MG tablet  HSV (herpes simplex virus) infection - Plan: valACYclovir (VALTREX) 1000 MG tablet  Cough - Plan: HYDROcodone-homatropine (HYCODAN) 5-1.5 MG/5ML syrup  Suspect that Lorcan may have a viral URI.  However, given his flu- like symptoms and risk of RMSF will cover with doxycycline.  Discussed importance of monitoring his temperature and letting me know if he is worse or not better.  CXR shows mild bronchitis changes- doxycycline will cover here as well.  Hycodan as needed for cough-  avoid other sedatives such as hydrocodone while using hycodan  Check HIV given his night sweats    Refilled his valtrex for suppression of HSV  Refer for CT to follow-up lung nodule  Abdominal and flank discomfort- await amylas/ lipase.  Only mild tenderness on exam.  Also consider nephrolithiasis, but await urine culture.  His discomfort is mild and non- specific, could also be related to his lung nodule.  Will follow- up pending labs, Sooner if worse.    Signed Abbe Amsterdam, MD

## 2012-12-07 NOTE — Patient Instructions (Addendum)
We are going to treat you with doxycycline for possible tick borne illness and pneumonia. I will be in touch with the rest of your labs as soon as they come in, and we will set up your chest CT .   [place] Spotted Fever is the most dangerous tick borne illness.  It responds to doxycycline, but can sometimes become very severe.  If you are getting worse or have any other concerns please let us know.

## 2012-12-08 ENCOUNTER — Telehealth: Payer: Self-pay | Admitting: Family Medicine

## 2012-12-08 DIAGNOSIS — D72819 Decreased white blood cell count, unspecified: Secondary | ICD-10-CM

## 2012-12-08 LAB — URINE CULTURE: Colony Count: NO GROWTH

## 2012-12-08 LAB — COMPREHENSIVE METABOLIC PANEL
ALT: 16 U/L (ref 0–53)
Alkaline Phosphatase: 67 U/L (ref 39–117)
CO2: 30 mEq/L (ref 19–32)
Creat: 0.88 mg/dL (ref 0.50–1.35)
Sodium: 138 mEq/L (ref 135–145)
Total Bilirubin: 0.5 mg/dL (ref 0.3–1.2)

## 2012-12-08 LAB — HIV ANTIBODY (ROUTINE TESTING W REFLEX): HIV: NONREACTIVE

## 2012-12-08 NOTE — Telephone Encounter (Signed)
Called him- he is feeling better.  Await urine culture and will follow- up results.  Will order recheck CBC as a future lab now

## 2012-12-08 NOTE — Telephone Encounter (Signed)
Called and LM on cell phone- all labs so far look good (did not mention HIV by name in case of security problem) will be in touch again when urine culture comes back

## 2012-12-10 ENCOUNTER — Encounter: Payer: Self-pay | Admitting: Family Medicine

## 2012-12-10 NOTE — Addendum Note (Signed)
Addended by: Abbe Amsterdam C on: 12/10/2012 07:19 AM   Modules accepted: Orders

## 2013-01-05 ENCOUNTER — Other Ambulatory Visit: Payer: Self-pay

## 2013-03-08 ENCOUNTER — Other Ambulatory Visit: Payer: Self-pay | Admitting: Orthopaedic Surgery

## 2013-03-08 ENCOUNTER — Ambulatory Visit
Admission: RE | Admit: 2013-03-08 | Discharge: 2013-03-08 | Disposition: A | Discharge status: Home / Self Care | Payer: BC Managed Care – PPO | Source: Ambulatory Visit | Attending: Orthopaedic Surgery | Admitting: Orthopaedic Surgery

## 2013-03-08 DIAGNOSIS — M25562 Pain in left knee: Secondary | ICD-10-CM

## 2013-03-13 ENCOUNTER — Encounter (INDEPENDENT_AMBULATORY_CARE_PROVIDER_SITE_OTHER): Payer: Self-pay | Admitting: Surgery

## 2013-03-13 ENCOUNTER — Ambulatory Visit (INDEPENDENT_AMBULATORY_CARE_PROVIDER_SITE_OTHER): Payer: BC Managed Care – PPO | Admitting: Surgery

## 2013-03-13 VITALS — BP 148/90 | HR 86 | Temp 97.6°F | Resp 18 | Ht 72.0 in | Wt 242.6 lb

## 2013-03-13 DIAGNOSIS — R229 Localized swelling, mass and lump, unspecified: Secondary | ICD-10-CM

## 2013-03-13 DIAGNOSIS — R220 Localized swelling, mass and lump, head: Secondary | ICD-10-CM

## 2013-03-13 NOTE — Progress Notes (Signed)
Subjective:     Patient ID: Martin [name], male   DOB: April 24, 1971, 42 y.o.   MRN: [id number]  HPI I have seen him in September for painful sebaceous cyst. He decided to defer surgery. He is now developed another one on the scalp and now wishes to proceed with surgical excision of all 3 sebaceous cysts  Review of Systems     Objective:   Physical Exam On exam, there is a 1 cm cyst on his back, a 4 cm cyst on the right posterior scalp and a less than 1 mm cyst on the top of his head    Assessment:     Painful sebaceous cysts     Plan:     We will now proceed with surgical excision in the operating room. I again discussed risks.

## 2013-03-21 ENCOUNTER — Telehealth (INDEPENDENT_AMBULATORY_CARE_PROVIDER_SITE_OTHER): Payer: Self-pay | Admitting: General Surgery

## 2013-03-21 NOTE — Telephone Encounter (Signed)
LMOM for patient to call back and ask for Amrit Cress 

## 2013-03-21 NOTE — Telephone Encounter (Signed)
Message copied by Wilder Glade on Tue Mar 21, 2013 11:22 AM ------      Message from: Docia Chuck      Created: Tue Mar 21, 2013 10:53 AM      Regarding: Can you please        I have him schedule with Dr. Magnus Ivan for surgery on 8/11 @ [facility] and he wants to know his recovery time.  Can you please call him when you have time @ [phone].            Thanks ------

## 2013-04-03 ENCOUNTER — Encounter (HOSPITAL_COMMUNITY): Payer: Self-pay | Admitting: Pharmacy Technician

## 2013-04-07 ENCOUNTER — Encounter (HOSPITAL_COMMUNITY)
Admission: RE | Admit: 2013-04-07 | Discharge: 2013-04-07 | Disposition: A | Discharge status: Home / Self Care | Payer: BC Managed Care – PPO | Source: Ambulatory Visit | Attending: Surgery | Admitting: Surgery

## 2013-04-07 NOTE — Pre-Procedure Instructions (Signed)
GATSBY CHISMAR  04/07/2013   Your procedure is scheduled on:  August 11  Report to [facility] at 07:45 AM.  Call this number if you have problems the morning of surgery: [phone]   Remember:   Do not eat food or drink liquids after midnight.   Take these medicines the morning of surgery with A SIP OF WATER: Valtrex   STOP Ibuprofen Monday. Do not use Aleve or Naproxen  Do not wear jewelry, make-up or nail polish.  Do not wear lotions, powders, or perfumes. You may wear deodorant.  Do not shave 48 hours prior to surgery. Men may shave face and neck.  Do not bring valuables to the hospital.  [facility] is not responsible for any belongings or valuables.  Contacts, dentures or bridgework may not be worn into surgery.  Leave suitcase in the car. After surgery it may be brought to your room.  For patients admitted to the hospital, checkout time is 11:00 AM the day of discharge.   Patients discharged the day of surgery will not be allowed to drive home.  Name and phone number of your driver: Family/ Friend  Special Instructions: Shower using CHG 2 nights before surgery and the night before surgery.  If you shower the day of surgery use CHG.  Use special wash - you have one bottle of CHG for all showers.  You should use approximately 1/3 of the bottle for each shower.   Please read over the following fact sheets that you were given: Pain Booklet, Coughing and Deep Breathing and Surgical Site Infection Prevention

## 2013-04-12 ENCOUNTER — Encounter (HOSPITAL_COMMUNITY): Payer: Self-pay

## 2013-04-12 ENCOUNTER — Encounter (HOSPITAL_COMMUNITY)
Admission: RE | Admit: 2013-04-12 | Discharge: 2013-04-12 | Disposition: A | Discharge status: Home / Self Care | Payer: BC Managed Care – PPO | Source: Ambulatory Visit | Attending: Surgery | Admitting: Surgery

## 2013-04-12 LAB — BASIC METABOLIC PANEL
CO2: 25 mEq/L (ref 19–32)
Glucose, Bld: 83 mg/dL (ref 70–99)
Potassium: 3.9 mEq/L (ref 3.5–5.1)
Sodium: 136 mEq/L (ref 135–145)

## 2013-04-12 LAB — CBC
Hemoglobin: 15 g/dL (ref 13.0–17.0)
MCH: 30.1 pg (ref 26.0–34.0)
MCV: 88.4 fL (ref 78.0–100.0)
RBC: 4.99 MIL/uL (ref 4.22–5.81)

## 2013-04-12 NOTE — Progress Notes (Signed)
Patient's BP elevated ,has not seen PCP in 1 year, left arm 170/102 and right arm 178/98, Revonda Standard PA made aware. Patient is agreeable to see PCP in AM 8/7/ , Dr Loleta Chance at [facility] at 0930 AM .

## 2013-04-13 NOTE — Progress Notes (Signed)
Anesthesia chart review:  Patient is a 42 year old male scheduled for excision of scalp and back sebaceous cysts on 04/17/13 by Dr. Magnus Ivan.  History includes obesity, smoking, HTN, appendectomy, ankle surgery.  He is not currently on BP medication, but is scheduled to see Dr. Loleta Chance at the [facility] this morning since he BP was elevated at 174/108 (when rechecked was 170/102, 178/98) at PAT yesterday.    EKG on 04/12/13 showed NSR.  CXR on 12/07/12 showed mild bronchitic changes.  Preoperative CBC and BMET were WNL.  If his BP is better controlled by his surgery date then I would anticipate that he could proceed as planned. Staff message sent to Dr. Magnus Ivan for update.  Velna Ochs [facility]/Anesthesiology Phone [phone] 04/13/2013 9:41 AM

## 2013-04-16 MED ORDER — CEFAZOLIN SODIUM-DEXTROSE 2-3 GM-% IV SOLR
2.0000 g | INTRAVENOUS | Status: AC
  Administered 2013-04-17: 2 g via INTRAVENOUS
  Filled 2013-04-16: qty 50

## 2013-04-16 NOTE — H&P (Signed)
Patient ID: OMAREE [name], male DOB: 08-10-1971, 42 y.o. MRN: [id number]  Chief Complaint   Patient presents with   .  New Evaluation     cyst on scalp   HPI  Martin [name] is a 42 y.o. male.  HPIThis gentleman is a self-referral for evaluation of a posterior scalp mass. It has been present for approximately a year and is now getting larger and causing to have pain including headaches. He also has a chronic sebaceous cyst on his back which occasionally drains. He reports the mass was has never drained.  History reviewed. No pertinent past medical history.  History reviewed. No pertinent past surgical history.  History reviewed. No pertinent family history.  Social History  History   Substance Use Topics   .  Smoking status:  Current Every Day Smoker     Types:  Cigarettes   .  Smokeless tobacco:  Not on file   .  Alcohol Use:  Not on file   No Known Allergies  Current Outpatient Prescriptions   Medication  Sig  Dispense  Refill   .  lisinopril-hydrochlorothiazide (PRINZIDE,ZESTORETIC) 10-12.5 MG per tablet  Take 1 tablet by mouth daily.     .  valACYclovir (VALTREX) 1000 MG tablet  Take 1 tablet (1,000 mg total) by mouth daily.  90 tablet  3   Review of Systems  Review of Systems  Constitutional: Negative for fever, chills and unexpected weight change.  HENT: Negative for hearing loss, congestion, sore throat, trouble swallowing and voice change.  Eyes: Negative for visual disturbance.  Respiratory: Negative for cough and wheezing.  Cardiovascular: Negative for chest pain, palpitations and leg swelling.  Gastrointestinal: Negative for nausea, vomiting, abdominal pain, diarrhea, constipation, blood in stool, abdominal distention, anal bleeding and rectal pain.  Genitourinary: Negative for hematuria and difficulty urinating.  Musculoskeletal: Negative for arthralgias.  Skin: Negative for rash and wound.  Neurological: Negative for seizures, syncope, weakness and headaches.   Hematological: Negative for adenopathy. Does not bruise/bleed easily.  Psychiatric/Behavioral: Negative for confusion.  Blood pressure 132/80, pulse 76, temperature 96.8 F (36 C), temperature source Temporal, resp. rate 18, height 6' (1.829 m), weight 246 lb 6.4 oz (111.766 kg), SpO2 99.00%.  Physical Exam  Physical Exam  Constitutional: He is oriented to person, place, and time. He appears well-developed and well-nourished. No distress.  HENT:  Head: Normocephalic and atraumatic.  Right Ear: External ear normal.  Left Ear: External ear normal.  Nose: Nose normal.  Mouth/Throat: Oropharynx is clear and moist.  There is a 2 cm mass on his right posterior scalp just above the neck. It is soft and slightly mobile, there is also a small <1cm cyst on the top of his head Eyes: Conjunctivae normal and EOM are normal. Pupils are equal, round, and reactive to light. Right eye exhibits no discharge. Left eye exhibits no discharge. No scleral icterus.  Neck: Normal range of motion. Neck supple. No JVD present. No tracheal deviation present. No thyromegaly present.  Cardiovascular: Normal rate, regular rhythm, normal heart sounds and intact distal pulses.  No murmur heard.  Pulmonary/Chest: Effort normal and breath sounds normal. No respiratory distress. He has no wheezes.  There is a one cm sebaceous cyst on his upper back in the midline. There is chronic skin changes consistent with previous infection  Lymphadenopathy:  He has no cervical adenopathy.  Neurological: He is alert and oriented to person, place, and time.  Skin: Skin is warm and dry. He is  not diaphoretic.  Psychiatric: His behavior is normal.  Data Reviewed  Assessment  2 cm posterior scalp mass  1 cm chronic sebaceous cyst of the MID back  Plan  Removal of these areas recommended given his symptoms and previous infection and to also rule out malignancy with histologic evaluation. I've discussed this with him in detail. I've  discussed the risks of surgery which includes but is not limited to bleeding, infection, injury to surrounding structures, recurrence, etc. He understands and wishes to proceed. Surgery will be scheduled. Likelihood of success is good

## 2013-04-17 ENCOUNTER — Ambulatory Visit (HOSPITAL_COMMUNITY): Payer: BC Managed Care – PPO | Admitting: Critical Care Medicine

## 2013-04-17 ENCOUNTER — Ambulatory Visit (HOSPITAL_COMMUNITY)
Admission: RE | Admit: 2013-04-17 | Discharge: 2013-04-17 | Disposition: A | Discharge status: Home / Self Care | Payer: BC Managed Care – PPO | Source: Ambulatory Visit | Attending: Surgery | Admitting: Surgery

## 2013-04-17 ENCOUNTER — Encounter (HOSPITAL_COMMUNITY): Payer: Self-pay | Admitting: Vascular Surgery

## 2013-04-17 ENCOUNTER — Encounter (HOSPITAL_COMMUNITY)
Admission: RE | Disposition: A | Discharge status: Home / Self Care | Payer: Self-pay | Source: Ambulatory Visit | Attending: Surgery

## 2013-04-17 ENCOUNTER — Encounter (HOSPITAL_COMMUNITY): Payer: Self-pay | Admitting: Critical Care Medicine

## 2013-04-17 DIAGNOSIS — Z79899 Other long term (current) drug therapy: Secondary | ICD-10-CM | POA: Insufficient documentation

## 2013-04-17 DIAGNOSIS — L723 Sebaceous cyst: Secondary | ICD-10-CM | POA: Insufficient documentation

## 2013-04-17 DIAGNOSIS — F17200 Nicotine dependence, unspecified, uncomplicated: Secondary | ICD-10-CM | POA: Insufficient documentation

## 2013-04-17 HISTORY — PX: EAR CYST EXCISION: SHX22

## 2013-04-17 SURGERY — CYST REMOVAL
Anesthesia: Monitor Anesthesia Care | Site: Head | Wound class: Dirty or Infected

## 2013-04-17 MED ORDER — LIDOCAINE HCL 1 % IJ SOLN
INTRAMUSCULAR | Status: DC | PRN
  Administered 2013-04-17: 13 mL via INTRADERMAL

## 2013-04-17 MED ORDER — KETOROLAC TROMETHAMINE 30 MG/ML IJ SOLN
INTRAMUSCULAR | Status: DC | PRN
  Administered 2013-04-17: 30 mg via INTRAVENOUS

## 2013-04-17 MED ORDER — BUPIVACAINE-EPINEPHRINE 0.25% -1:200000 IJ SOLN
INTRAMUSCULAR | Status: DC | PRN
  Administered 2013-04-17: 10 mL

## 2013-04-17 MED ORDER — HYDROCODONE-ACETAMINOPHEN 5-325 MG PO TABS: 1.0000 | ORAL_TABLET | ORAL | Status: DC | PRN

## 2013-04-17 MED ORDER — FENTANYL CITRATE 0.05 MG/ML IJ SOLN
INTRAMUSCULAR | Status: DC | PRN
  Administered 2013-04-17: 25 ug via INTRAVENOUS
  Administered 2013-04-17: 100 ug via INTRAVENOUS

## 2013-04-17 MED ORDER — ONDANSETRON HCL 4 MG/2ML IJ SOLN: 4.0000 mg | Freq: Once | INTRAMUSCULAR | Status: DC | PRN

## 2013-04-17 MED ORDER — HYDROMORPHONE HCL PF 1 MG/ML IJ SOLN: 0.2500 mg | INTRAMUSCULAR | Status: DC | PRN

## 2013-04-17 MED ORDER — OXYCODONE HCL 5 MG/5ML PO SOLN: 5.0000 mg | Freq: Once | ORAL | Status: DC | PRN

## 2013-04-17 MED ORDER — BUPIVACAINE-EPINEPHRINE PF 0.25-1:200000 % IJ SOLN
INTRAMUSCULAR | Status: AC
  Filled 2013-04-17: qty 30

## 2013-04-17 MED ORDER — PROPOFOL INFUSION 10 MG/ML OPTIME
INTRAVENOUS | Status: DC | PRN
  Administered 2013-04-17: 75 ug/kg/min via INTRAVENOUS

## 2013-04-17 MED ORDER — LIDOCAINE HCL (PF) 1 % IJ SOLN
INTRAMUSCULAR | Status: AC
  Filled 2013-04-17: qty 30

## 2013-04-17 MED ORDER — LACTATED RINGERS IV SOLN
INTRAVENOUS | Status: DC
  Administered 2013-04-17 (×2): via INTRAVENOUS

## 2013-04-17 MED ORDER — ONDANSETRON HCL 4 MG/2ML IJ SOLN
INTRAMUSCULAR | Status: DC | PRN
  Administered 2013-04-17: 4 mg via INTRAVENOUS

## 2013-04-17 MED ORDER — OXYCODONE HCL 5 MG PO TABS: 5.0000 mg | ORAL_TABLET | Freq: Once | ORAL | Status: DC | PRN

## 2013-04-17 MED ORDER — 0.9 % SODIUM CHLORIDE (POUR BTL) OPTIME
TOPICAL | Status: DC | PRN
  Administered 2013-04-17: 1000 mL

## 2013-04-17 MED ORDER — PROPOFOL 10 MG/ML IV BOLUS
INTRAVENOUS | Status: DC | PRN
  Administered 2013-04-17 (×3): 20 mg via INTRAVENOUS
  Administered 2013-04-17: 100 mg via INTRAVENOUS

## 2013-04-17 MED ORDER — MIDAZOLAM HCL 5 MG/5ML IJ SOLN
INTRAMUSCULAR | Status: DC | PRN
  Administered 2013-04-17: 2 mg via INTRAVENOUS

## 2013-04-17 SURGICAL SUPPLY — 40 items
BENZOIN TINCTURE PRP APPL 2/3 (GAUZE/BANDAGES/DRESSINGS) IMPLANT
BLADE SURG 10 STRL SS (BLADE) ×2 IMPLANT
BLADE SURG 15 STRL LF DISP TIS (BLADE) ×1 IMPLANT
BLADE SURG 15 STRL SS (BLADE) ×1
CANISTER SUCTION 2500CC (MISCELLANEOUS) ×2 IMPLANT
CLOTH BEACON ORANGE TIMEOUT ST (SAFETY) ×2 IMPLANT
COVER SURGICAL LIGHT HANDLE (MISCELLANEOUS) ×2 IMPLANT
DECANTER SPIKE VIAL GLASS SM (MISCELLANEOUS) ×2 IMPLANT
DERMABOND ADVANCED (GAUZE/BANDAGES/DRESSINGS) ×2
DERMABOND ADVANCED .7 DNX12 (GAUZE/BANDAGES/DRESSINGS) ×2 IMPLANT
DRAPE LAPAROSCOPIC ABDOMINAL (DRAPES) ×2 IMPLANT
DRAPE PED LAPAROTOMY (DRAPES) IMPLANT
ELECT CAUTERY BLADE 6.4 (BLADE) ×2 IMPLANT
ELECT REM PT RETURN 9FT ADLT (ELECTROSURGICAL) ×2
ELECTRODE REM PT RTRN 9FT ADLT (ELECTROSURGICAL) ×1 IMPLANT
GLOVE SURG SIGNA 7.5 PF LTX (GLOVE) ×2 IMPLANT
GOWN STRL NON-REIN LRG LVL3 (GOWN DISPOSABLE) ×2 IMPLANT
GOWN STRL REIN XL XLG (GOWN DISPOSABLE) ×2 IMPLANT
KIT BASIN OR (CUSTOM PROCEDURE TRAY) ×2 IMPLANT
KIT ROOM TURNOVER OR (KITS) ×2 IMPLANT
NEEDLE HYPO 25X1 1.5 SAFETY (NEEDLE) ×2 IMPLANT
NS IRRIG 1000ML POUR BTL (IV SOLUTION) ×2 IMPLANT
PACK SURGICAL SETUP 50X90 (CUSTOM PROCEDURE TRAY) ×2 IMPLANT
PAD ARMBOARD 7.5X6 YLW CONV (MISCELLANEOUS) ×2 IMPLANT
PENCIL BUTTON HOLSTER BLD 10FT (ELECTRODE) ×2 IMPLANT
SPECIMEN JAR SMALL (MISCELLANEOUS) ×2 IMPLANT
SPONGE GAUZE 4X4 12PLY (GAUZE/BANDAGES/DRESSINGS) ×2 IMPLANT
SPONGE LAP 18X18 X RAY DECT (DISPOSABLE) ×2 IMPLANT
STRIP CLOSURE SKIN 1/2X4 (GAUZE/BANDAGES/DRESSINGS) ×2 IMPLANT
SUT ETHILON 3 0 PS 1 (SUTURE) ×2 IMPLANT
SUT MNCRL AB 4-0 PS2 18 (SUTURE) ×2 IMPLANT
SUT VIC AB 3-0 SH 27 (SUTURE) ×3
SUT VIC AB 3-0 SH 27X BRD (SUTURE) ×2 IMPLANT
SUT VIC AB 3-0 SH 27XBRD (SUTURE) ×1 IMPLANT
SYR BULB 3OZ (MISCELLANEOUS) ×2 IMPLANT
SYR CONTROL 10ML LL (SYRINGE) ×2 IMPLANT
TOWEL OR 17X24 6PK STRL BLUE (TOWEL DISPOSABLE) ×2 IMPLANT
TOWEL OR 17X26 10 PK STRL BLUE (TOWEL DISPOSABLE) ×2 IMPLANT
TUBE CONNECTING 12X1/4 (SUCTIONS) ×2 IMPLANT
YANKAUER SUCT BULB TIP NO VENT (SUCTIONS) ×2 IMPLANT

## 2013-04-17 NOTE — Anesthesia Procedure Notes (Signed)
Procedure Name: MAC Date/Time: 04/17/2013 9:37 AM Performed by: Elon Alas Pre-anesthesia Checklist: Patient identified, Emergency Drugs available, Suction available, Patient being monitored and Timeout performed Oxygen Delivery Method: Simple face mask Intubation Type: IV induction Placement Confirmation: positive ETCO2 and breath sounds checked- equal and bilateral Dental Injury: Teeth and Oropharynx as per pre-operative assessment

## 2013-04-17 NOTE — Interval H&P Note (Signed)
History and Physical Interval Note: no change in H and P  04/17/2013 8:39 AM  Martin [name]  has presented today for surgery, with the diagnosis of cyst scalps x2, back  The various methods of treatment have been discussed with the patient and family. After consideration of risks, benefits and other options for treatment, the patient has consented to  Procedure(s): CYST REMOVAL ON SCALP X2, BACK (N/A) as a surgical intervention .  The patient's history has been reviewed, patient examined, no change in status, stable for surgery.  I have reviewed the patient's chart and labs.  Questions were answered to the patient's satisfaction.     Jazlene Bares A

## 2013-04-17 NOTE — Preoperative (Signed)
Beta Blockers   Reason not to administer Beta Blockers:Not Applicable 

## 2013-04-17 NOTE — Anesthesia Preprocedure Evaluation (Addendum)
Anesthesia Evaluation  Patient identified by MRN, date of birth, ID band Patient awake    Airway Mallampati: I TM Distance: >3 FB Neck ROM: Full    Dental  (+) Teeth Intact and Dental Advisory Given   Pulmonary Current Smoker,  breath sounds clear to auscultation        Cardiovascular hypertension, Pt. on medications Rhythm:Regular Rate:Normal     Neuro/Psych    GI/Hepatic   Endo/Other  Morbid obesity  Renal/GU      Musculoskeletal   Abdominal   Peds  Hematology   Anesthesia Other Findings   Reproductive/Obstetrics                        Anesthesia Physical Anesthesia Plan  ASA: II  Anesthesia Plan: MAC   Post-op Pain Management:    Induction: Intravenous  Airway Management Planned: Simple Face Mask and Mask  Additional Equipment:   Intra-op Plan:   Post-operative Plan:   Informed Consent: I have reviewed the patients History and Physical, chart, labs and discussed the procedure including the risks, benefits and alternatives for the proposed anesthesia with the patient or authorized representative who has indicated his/her understanding and acceptance.   Dental advisory given  Plan Discussed with: CRNA, Anesthesiologist and Surgeon  Anesthesia Plan Comments:         Anesthesia Quick Evaluation

## 2013-04-17 NOTE — Transfer of Care (Signed)
Immediate Anesthesia Transfer of Care Note  Patient: Martin [name]  Procedure(s) Performed: Procedure(s) with comments: CYST REMOVAL ON SCALP X2, BACK (N/A) - head and back  Patient Location: PACU  Anesthesia Type:MAC  Level of Consciousness: awake, alert  and oriented  Airway & Oxygen Therapy: Patient Spontanous Breathing  Post-op Assessment: Report given to PACU RN, Post -op Vital signs reviewed and stable and Patient moving all extremities X 4  Post vital signs: Reviewed and stable  Complications: No apparent anesthesia complications

## 2013-04-17 NOTE — Anesthesia Postprocedure Evaluation (Signed)
  Anesthesia Post-op Note  Patient: Martin [name]  Procedure(s) Performed: Procedure(s) with comments: CYST REMOVAL ON SCALP X2, BACK (N/A) - head and back  Patient Location: PACU  Anesthesia Type:MAC  Level of Consciousness: awake, alert  and oriented  Airway and Oxygen Therapy: Patient Spontanous Breathing  Post-op Pain: none  Post-op Assessment: Post-op Vital signs reviewed  Post-op Vital Signs: Reviewed  Complications: No apparent anesthesia complications

## 2013-04-17 NOTE — Op Note (Signed)
CYST REMOVAL ON SCALP X2, BACK  Procedure Note  Martin [name] 04/17/2013   Pre-op Diagnosis: cyst scalps x2 and back     Post-op Diagnosis: same  Procedure(s): CYST REMOVAL ON SCALP X2, BACK  Surgeon(s): Shelly Rubenstein, MD  Anesthesia: Choice  Staff:  Circulator: Patrica Duel Scrub Person: Charlott Rakes, RN Circulator Assistant: Gerre Pebbles Sipsis, RN  Estimated Blood Loss: Minimal               Specimens: sent to path          [facility]   Date: 04/17/2013  Time: 10:17 AM

## 2013-04-18 ENCOUNTER — Encounter (HOSPITAL_COMMUNITY): Payer: Self-pay | Admitting: Surgery

## 2013-04-18 NOTE — Op Note (Signed)
NAMEDEAGAN, Martin [name] NO.:  [id number]  MEDICAL RECORD NO.:  [id number]  LOCATION:  [facility]                         FACILITY:  [facility]  PHYSICIAN:  Abigail Miyamoto, M.D. DATE OF BIRTH:  07/25/1971  DATE OF PROCEDURE:  04/17/2013 DATE OF DISCHARGE:  04/17/2013                              OPERATIVE REPORT   PREOPERATIVE DIAGNOSIS:  Epidermal cyst on scalp x2 and back.  POSTOPERATIVE DIAGNOSIS:  Epidermal cyst on scalp x2 and back.  PROCEDURE: 1. Excision of 1 cm scalp cyst. 2. Excision of 2 cm right lower neck/scalp cyst. 3. Excision of 1 cm back cyst.  SURGEON:  Abigail Miyamoto, M.D.  ANESTHESIA:  Lidocaine 1%, 0.5% Marcaine, and monitored anesthesia care.  ESTIMATED BLOOD LOSS:  Minimal.  PROCEDURE IN DETAIL:  The patient was brought to the operating room, identified as Martin [name].  He was placed supine on the operating room table and anesthesia was induced.  He was then placed in the left lateral decubitus position.  His head, neck, and back were then prepped and draped in the usual sterile fashion.  I anesthetized the skin on top of it with 1% lidocaine.  I performed elliptical incision around the 1 cm cyst and took this down into the subcutaneous tissue with electrocautery.  I then excised the cyst in its entirety and sent to pathology for evaluation.  I then closed subcutaneous tissue with interrupted 3-0 Vicryl sutures and closed the skin with interrupted 3-0 nylon sutures.  Next, I anesthetized the skin on the right lower scalp/neck with lidocaine.  I made a small incision with a scalpel and identified a large 2-cm cyst containing spacious debris.  I was able to excise the cyst in its entirety and sent to pathology for evaluation. Hemostasis was achieved with cautery.  I then closed this wound with interrupted 3-0 Vicryl sutures and Dermabond.  His final cyst was on the mid back.  I anesthetized skin with lidocaine, made an incision with  a scalpel, and again I was able to excise the cyst in its entirety with the scalpel.  I then closed the wound with 3-0 Vicryl sutures and 3-0 nylon sutures.  I then anesthetized all wounds with Marcaine.  Gauze and tape were then applied.  The patient tolerated the procedure well.  All the counts were correct at the end of procedure. The patient was then taken in stable condition from the operating room to the recovery room.     Abigail Miyamoto, M.D.     DB/MEDQ  D:  04/17/2013  T:  04/18/2013  Job:  [id number]

## 2013-04-24 ENCOUNTER — Ambulatory Visit (INDEPENDENT_AMBULATORY_CARE_PROVIDER_SITE_OTHER): Payer: BC Managed Care – PPO | Admitting: Surgery

## 2013-04-24 ENCOUNTER — Encounter (INDEPENDENT_AMBULATORY_CARE_PROVIDER_SITE_OTHER): Payer: Self-pay | Admitting: Surgery

## 2013-04-24 VITALS — BP 130/90 | HR 76 | Resp 16 | Ht 72.0 in | Wt 245.2 lb

## 2013-04-24 DIAGNOSIS — Z09 Encounter for follow-up examination after completed treatment for conditions other than malignant neoplasm: Secondary | ICD-10-CM

## 2013-04-24 NOTE — Progress Notes (Signed)
Subjective:     Patient ID: Martin [name], male   DOB: [birth year], 42 y.o.   MRN: [id number]  HPI He is here for his first postoperative visit status post excision of cysts of the scalp neck, and back. He is doing well and has no complaints  Review of Systems     Objective:   Physical Exam His incisions are all healing well. I removed the sutures.    Assessment:     Patient stable postop     Plan:     I will see him back as needed

## 2014-04-16 ENCOUNTER — Ambulatory Visit (INDEPENDENT_AMBULATORY_CARE_PROVIDER_SITE_OTHER): Payer: BC Managed Care – PPO | Admitting: Physician Assistant

## 2014-04-16 VITALS — BP 118/84 | HR 87 | Temp 98.4°F | Resp 18 | Ht 72.0 in | Wt 233.8 lb

## 2014-04-16 DIAGNOSIS — J020 Streptococcal pharyngitis: Secondary | ICD-10-CM

## 2014-04-16 DIAGNOSIS — J029 Acute pharyngitis, unspecified: Secondary | ICD-10-CM

## 2014-04-16 DIAGNOSIS — Z113 Encounter for screening for infections with a predominantly sexual mode of transmission: Secondary | ICD-10-CM

## 2014-04-16 LAB — POCT RAPID STREP A (OFFICE): Rapid Strep A Screen: POSITIVE — AB

## 2014-04-16 MED ORDER — AMOXICILLIN 875 MG PO TABS: 875.0000 mg | ORAL_TABLET | Freq: Two times a day (BID) | ORAL | Status: AC

## 2014-04-16 MED ORDER — MAGIC MOUTHWASH W/LIDOCAINE: 10.0000 mL | ORAL | Status: DC | PRN

## 2014-04-16 NOTE — Progress Notes (Signed)
Subjective:    Patient ID: Martin [name], male    DOB: 1971-06-01, 43 y.o.   MRN: [id number]   PCP: No PCP Per Patient  Chief Complaint  Patient presents with  . Sore Throat    X Saturday night    Medications, allergies, past medical history, surgical history, family history, social history and problem list reviewed and updated.  Prior to Admission medications   Medication Sig Start Date End Date Taking? Authorizing Provider  amLODipine-benazepril (LOTREL) 5-10 MG per capsule Take 1 capsule by mouth daily.   Yes Historical Provider, MD  fenofibrate (TRICOR) 48 MG tablet Take 48 mg by mouth daily.   Yes Historical Provider, MD  HYDROcodone-acetaminophen (NORCO) 5-325 MG per tablet Take 1-2 tablets by mouth every 4 (four) hours as needed for pain. 04/17/13  Yes Harl Bowie, MD  ibuprofen (ADVIL,MOTRIN) 200 MG tablet Take 400-600 mg by mouth 3 (three) times daily as needed for pain.   Yes Historical Provider, MD  valACYclovir (VALTREX) 1000 MG tablet Take 1 tablet (1,000 mg total) by mouth daily. 12/07/12  Yes Darreld Mclean, MD    HPI  Presents with sore throat x 3 days.  Hurts to swallow.  Feels feverish but hasn't checked his temperature.  Achy all over.  Some headache.  No GI/GU symptoms. No nasal congestion, drainage, ear fullness, coughing, sneezing.  No known sick contacts, but did share drinks with multiple people on a recent trip.  He requests STI screening.  Last testing about 1 year ago.  No symptoms.  Review of Systems As above.    Objective:   Physical Exam  Constitutional: He is oriented to person, place, and time. He appears well-developed and well-nourished. He is cooperative. He appears distressed (wrapped in a blanket; obviously feels poorly.).  BP 118/84  Pulse 87  Temp(Src) 98.4 F (36.9 C) (Oral)  Resp 18  Ht 6' (1.829 m)  Wt 233 lb 12.8 oz (106.051 kg)  BMI 31.70 kg/m2  SpO2 96%   HENT:  Head: Normocephalic and atraumatic.  Right Ear:  Hearing, tympanic membrane, external ear and ear canal normal.  Left Ear: Hearing, tympanic membrane, external ear and ear canal normal.  Nose: Nose normal.  Mouth/Throat: Uvula is midline. No tonsillar abscesses.  Bilateral tonsillar exudate.  Eyes: Conjunctivae and EOM are normal. Pupils are equal, round, and reactive to light. Right eye exhibits no discharge. Left eye exhibits no discharge. No scleral icterus.  Neck: Normal range of motion. Neck supple. No thyromegaly present.  Cardiovascular: Normal rate, regular rhythm and normal heart sounds.   Pulmonary/Chest: Effort normal and breath sounds normal.  Lymphadenopathy:    He has no cervical adenopathy.  Neurological: He is alert and oriented to person, place, and time.  Skin: Skin is warm and dry.  Psychiatric: He has a normal mood and affect. His behavior is normal.      Results for orders placed in visit on 04/16/14  POCT RAPID STREP A (OFFICE)      Result Value Ref Range   Rapid Strep A Screen Positive (*) Negative       Assessment & Plan:  1. Acute pharyngitis, unspecified pharyngitis type - POCT rapid strep A  2. Streptococcal sore throat - amoxicillin (AMOXIL) 875 MG tablet; Take 1 tablet (875 mg total) by mouth 2 (two) times daily.  Dispense: 20 tablet; Refill: 0 - Alum & Mag Hydroxide-Simeth (MAGIC MOUTHWASH W/LIDOCAINE) SOLN; Take 10 mLs by mouth every 2 (two) hours as  needed for mouth pain.  Dispense: 360 mL; Refill: 0  3. Routine screening for STI (sexually transmitted infection) - Hepatitis B surface antigen - Hepatitis C antibody - Hepatitis B surface antibody - HIV antibody - RPR - GC/Chlamydia Probe Amp   Return if symptoms worsen or fail to improve.   Fara Chute, PA-C Physician Assistant-Certified [facility]

## 2014-04-16 NOTE — Patient Instructions (Addendum)
I will contact you with your lab results as soon as they are available.   If you have not heard from me in 2 weeks, please contact me.  The fastest way to get your results is to register for My Chart (see the instructions on the last page of this printout).    Strep Throat Strep throat is an infection of the throat caused by a bacteria named Streptococcus pyogenes. Your health care provider may call the infection streptococcal "tonsillitis" or "pharyngitis" depending on whether there are signs of inflammation in the tonsils or back of the throat. Strep throat is most common in children aged 5-15 years during the cold months of the year, but it can occur in people of any age during any season. This infection is spread from person to person (contagious) through coughing, sneezing, or other close contact. SIGNS AND SYMPTOMS   Fever or chills.  Painful, swollen, red tonsils or throat.  Pain or difficulty when swallowing.  White or yellow spots on the tonsils or throat.  Swollen, tender lymph nodes or "glands" of the neck or under the jaw.  Red rash all over the body (rare). DIAGNOSIS  Many different infections can cause the same symptoms. A test must be done to confirm the diagnosis so the right treatment can be given. A "rapid strep test" can help your health care provider make the diagnosis in a few minutes. If this test is not available, a light swab of the infected area can be used for a throat culture test. If a throat culture test is done, results are usually available in a day or two. TREATMENT  Strep throat is treated with antibiotic medicine. HOME CARE INSTRUCTIONS   Gargle with 1 tsp of salt in 1 cup of warm water, 3-4 times per day or as needed for comfort.  Family members who also have a sore throat or fever should be tested for strep throat and treated with antibiotics if they have the strep infection.  Make sure everyone in your household washes their hands well.  Do not  share food, drinking cups, or personal items that could cause the infection to spread to others.  You may need to eat a soft food diet until your sore throat gets better.  Drink enough water and fluids to keep your urine clear or pale yellow. This will help prevent dehydration.  Get plenty of rest.  Stay home from school, day care, or work until you have been on antibiotics for 24 hours.  Take medicines only as directed by your health care provider.  Take your antibiotic medicine as directed by your health care provider. Finish it even if you start to feel better. SEEK MEDICAL CARE IF:   The glands in your neck continue to enlarge.  You develop a rash, cough, or earache.  You cough up green, yellow-brown, or bloody sputum.  You have pain or discomfort not controlled by medicines.  Your problems seem to be getting worse rather than better.  You have a fever. SEEK IMMEDIATE MEDICAL CARE IF:   You develop any new symptoms such as vomiting, severe headache, stiff or painful neck, chest pain, shortness of breath, or trouble swallowing.  You develop severe throat pain, drooling, or changes in your voice.  You develop swelling of the neck, or the skin on the neck becomes red and tender.  You develop signs of dehydration, such as fatigue, dry mouth, and decreased urination.  You become increasingly sleepy, or you cannot wake  up completely. MAKE SURE YOU:  Understand these instructions.  Will watch your condition.  Will get help right away if you are not doing well or get worse. Document Released: 08/21/2000 Document Revised: 01/08/2014 Document Reviewed: 10/23/2010 [facility] Patient Information 2015 [place], [place]. This information is not intended to replace advice given to you by your health care provider. Make sure you discuss any questions you have with your health care provider.

## 2014-04-17 LAB — HEPATITIS B SURFACE ANTIGEN: Hepatitis B Surface Ag: NEGATIVE

## 2014-04-17 LAB — HEPATITIS C ANTIBODY: HCV Ab: NEGATIVE

## 2014-04-17 LAB — HEPATITIS B SURFACE ANTIBODY, QUANTITATIVE: Hep B S AB Quant (Post): 1.1 m[IU]/mL

## 2014-04-17 LAB — SYPHILIS: RPR W/REFLEX TO RPR TITER AND TREPONEMAL ANTIBODIES, TRADITIONAL SCREENING AND DIAGNOSIS ALGORITHM

## 2014-04-17 LAB — GC/CHLAMYDIA PROBE AMP
CT Probe RNA: NEGATIVE
GC Probe RNA: NEGATIVE

## 2014-04-17 LAB — HIV ANTIBODY (ROUTINE TESTING W REFLEX): HIV 1&2 Ab, 4th Generation: NONREACTIVE

## 2014-04-17 NOTE — Progress Notes (Signed)
Quick Note:  Please call the patient with these results. All negative, and he needs the Hepatitis B vaccine series. He can get that at his convenience once he is recovered from strep throat. ______

## 2014-05-21 ENCOUNTER — Ambulatory Visit (INDEPENDENT_AMBULATORY_CARE_PROVIDER_SITE_OTHER): Payer: BC Managed Care – PPO | Admitting: Internal Medicine

## 2014-05-21 VITALS — BP 148/96 | HR 82 | Temp 97.7°F | Resp 18 | Ht 72.0 in | Wt 235.0 lb

## 2014-05-21 DIAGNOSIS — N3001 Acute cystitis with hematuria: Secondary | ICD-10-CM

## 2014-05-21 DIAGNOSIS — R350 Frequency of micturition: Secondary | ICD-10-CM

## 2014-05-21 DIAGNOSIS — N3000 Acute cystitis without hematuria: Secondary | ICD-10-CM

## 2014-05-21 DIAGNOSIS — Z23 Encounter for immunization: Secondary | ICD-10-CM

## 2014-05-21 DIAGNOSIS — N410 Acute prostatitis: Secondary | ICD-10-CM

## 2014-05-21 LAB — POCT UA - MICROSCOPIC ONLY
Bacteria, U Microscopic: NEGATIVE
CASTS, UR, LPF, POC: NEGATIVE
Crystals, Ur, HPF, POC: NEGATIVE
EPITHELIAL CELLS, URINE PER MICROSCOPY: NEGATIVE
Mucus, UA: NEGATIVE
YEAST UA: NEGATIVE

## 2014-05-21 LAB — POCT URINALYSIS DIPSTICK
Bilirubin, UA: NEGATIVE
Glucose, UA: NEGATIVE
Ketones, UA: NEGATIVE
LEUKOCYTES UA: NEGATIVE
Nitrite, UA: NEGATIVE
PH UA: 5.5
PROTEIN UA: 100
Spec Grav, UA: 1.01
UROBILINOGEN UA: 0.2

## 2014-05-21 MED ORDER — CIPROFLOXACIN HCL 500 MG PO TABS: 500.0000 mg | ORAL_TABLET | Freq: Two times a day (BID) | ORAL | Status: DC

## 2014-05-21 NOTE — Patient Instructions (Addendum)
Urinary Tract Infection Urinary tract infections (UTIs) can develop anywhere along your urinary tract. Your urinary tract is your body's drainage system for removing wastes and extra water. Your urinary tract includes two kidneys, two ureters, a bladder, and a urethra. Your kidneys are a pair of bean-shaped organs. Each kidney is about the size of your fist. They are located below your ribs, one on each side of your spine. CAUSES Infections are caused by microbes, which are microscopic organisms, including fungi, viruses, and bacteria. These organisms are so small that they can only be seen through a microscope. Bacteria are the microbes that most commonly cause UTIs. SYMPTOMS  Symptoms of UTIs may vary by age and gender of the patient and by the location of the infection. Symptoms in young women typically include a frequent and intense urge to urinate and a painful, burning feeling in the bladder or urethra during urination. Older women and men are more likely to be tired, shaky, and weak and have muscle aches and abdominal pain. A fever may mean the infection is in your kidneys. Other symptoms of a kidney infection include pain in your back or sides below the ribs, nausea, and vomiting. DIAGNOSIS To diagnose a UTI, your caregiver will ask you about your symptoms. Your caregiver also will ask to provide a urine sample. The urine sample will be tested for bacteria and white blood cells. White blood cells are made by your body to help fight infection. TREATMENT  Typically, UTIs can be treated with medication. Because most UTIs are caused by a bacterial infection, they usually can be treated with the use of antibiotics. The choice of antibiotic and length of treatment depend on your symptoms and the type of bacteria causing your infection. HOME CARE INSTRUCTIONS  If you were prescribed antibiotics, take them exactly as your caregiver instructs you. Finish the medication even if you feel better after you  have only taken some of the medication.  Drink enough water and fluids to keep your urine clear or pale yellow.  Avoid caffeine, tea, and carbonated beverages. They tend to irritate your bladder.  Empty your bladder often. Avoid holding urine for long periods of time.  Empty your bladder before and after sexual intercourse.  After a bowel movement, women should cleanse from front to back. Use each tissue only once. SEEK MEDICAL CARE IF:   You have back pain.  You develop a fever.  Your symptoms do not begin to resolve within 3 days. SEEK IMMEDIATE MEDICAL CARE IF:   You have severe back pain or lower abdominal pain.  You develop chills.  You have nausea or vomiting.  You have continued burning or discomfort with urination. MAKE SURE YOU:   Understand these instructions.  Will watch your condition.  Will get help right away if you are not doing well or get worse. Document Released: 06/03/2005 Document Revised: 02/23/2012 Document Reviewed: 10/02/2011 [facility] Patient Information 2015 [place], [place]. This information is not intended to replace advice given to you by your health care provider. Make sure you discuss any questions you have with your health care provider. Prostatitis The prostate gland is about the size and shape of a walnut. It is located just below your bladder. It produces one of the components of semen, which is made up of sperm and the fluids that help nourish and transport it out from the testicles. Prostatitis is inflammation of the prostate gland.  There are four types of prostatitis:  Acute bacterial prostatitis. This is  the least common type of prostatitis. It starts quickly and usually is associated with a bladder infection, high fever, and shaking chills. It can occur at any age.  Chronic bacterial prostatitis. This is a persistent bacterial infection in the prostate. It usually develops from repeated acute bacterial prostatitis or acute bacterial  prostatitis that was not properly treated. It can occur in men of any age but is most common in middle-aged men whose prostate has begun to enlarge. The symptoms are not as severe as those in acute bacterial prostatitis. Discomfort in the part of your body that is in front of your rectum and below your scrotum (perineum), lower abdomen, or in the head of your penis (glans) may represent your primary discomfort.  Chronic prostatitis (nonbacterial). This is the most common type of prostatitis. It is inflammation of the prostate gland that is not caused by a bacterial infection. The cause is unknown and may be associated with a viral infection or autoimmune disorder.  Prostatodynia (pelvic floor disorder). This is associated with increased muscular tone in the pelvis surrounding the prostate. CAUSES The causes of bacterial prostatitis are bacterial infection. The causes of the other types of prostatitis are unknown.  SYMPTOMS  Symptoms can vary depending upon the type of prostatitis that exists. There can also be overlap in symptoms. Possible symptoms for each type of prostatitis are listed below. Acute Bacterial Prostatitis  Painful urination.  Fever or chills.  Muscle or joint pains.  Low back pain.  Low abdominal pain.  Inability to empty bladder completely. Chronic Bacterial Prostatitis, Chronic Nonbacterial Prostatitis, and Prostatodynia  Sudden urge to urinate.  Frequent urination.  Difficulty starting urine stream.  Weak urine stream.  Discharge from the urethra.  Dribbling after urination.  Rectal pain.  Pain in the testicles, penis, or tip of the penis.  Pain in the perineum.  Problems with sexual function.  Painful ejaculation.  Bloody semen. DIAGNOSIS  In order to diagnose prostatitis, your health care provider will ask about your symptoms. One or more urine samples will be taken and tested (urinalysis). If the urinalysis result is negative for bacteria, your  health care provider may use a finger to feel your prostate (digital rectal exam). This exam helps your health care provider determine if your prostate is swollen and tender. It will also produce a specimen of semen that can be analyzed. TREATMENT  Treatment for prostatitis depends on the cause. If a bacterial infection is the cause, it can be treated with antibiotic medicine. In cases of chronic bacterial prostatitis, the use of antibiotics for up to 1 month or 6 weeks may be necessary. Your health care provider may instruct you to take sitz baths to help relieve pain. A sitz bath is a bath of hot water in which your hips and buttocks are under water. This relaxes the pelvic floor muscles and often helps to relieve the pressure on your prostate. HOME CARE INSTRUCTIONS   Take all medicines as directed by your health care provider.  Take sitz baths as directed by your health care provider. SEEK MEDICAL CARE IF:   Your symptoms get worse, not better.  You have a fever. SEEK IMMEDIATE MEDICAL CARE IF:   You have chills.  You feel nauseous or vomit.  You feel lightheaded or faint.  You are unable to urinate.  You have blood or blood clots in your urine. MAKE SURE YOU:  Understand these instructions.  Will watch your condition.  Will get help right away if  you are not doing well or get worse. Document Released: 08/21/2000 Document Revised: 08/29/2013 Document Reviewed: 03/13/2013 [facility] Patient Information 2015 [place], [place]. This information is not intended to replace advice given to you by your health care provider. Make sure you discuss any questions you have with your health care provider. Immunization Schedule, Adult  Influenza vaccine.  All adults should be immunized every year.  All adults, including pregnant women and people with hives-only allergy to eggs can receive the inactivated influenza (IIV) vaccine.  Adults aged 18-49 years can receive the recombinant  influenza (RIV) vaccine. The RIV vaccine does not contain any egg protein.  Adults aged 64 years or older can receive the standard-dose IIV or the high-dose IIV.  Tetanus, diphtheria, and acellular pertussis (Td, Tdap) vaccine.  Pregnant women should receive 1 dose of Tdap vaccine during each pregnancy. The dose should be obtained regardless of the length of time since the last dose. Immunization is preferred during the 27th to 36th week of gestation.  An adult who has not previously received Tdap or who does not know his or her vaccine status should receive 1 dose of Tdap. This initial dose should be followed by tetanus and diphtheria toxoids (Td) booster doses every 10 years.  Adults with an unknown or incomplete history of completing a 3-dose immunization series with Td-containing vaccines should begin or complete a primary immunization series including a Tdap dose.  Adults should receive a Td booster every 10 years.  Varicella vaccine.  An adult without evidence of immunity to varicella should receive 2 doses or a second dose if he or she has previously received 1 dose.  Pregnant females who do not have evidence of immunity should receive the first dose after pregnancy. This first dose should be obtained before leaving the health care facility. The second dose should be obtained 4-8 weeks after the first dose.  Human papillomavirus (HPV) vaccine.  Females aged 13-26 years who have not received the vaccine previously should obtain the 3-dose series.  The vaccine is not recommended for use in pregnant females. However, pregnancy testing is not needed before receiving a dose. If a male is found to be pregnant after receiving a dose, no treatment is needed. In that case, the remaining doses should be delayed until after the pregnancy.  Males aged 67-21 years who have not received the vaccine previously should receive the 3-dose series. Males aged 22-26 years may be  immunized.  Immunization is recommended through the age of 32 years for any male who has sex with males and did not get any or all doses earlier.  Immunization is recommended for any person with an immunocompromised condition through the age of 41 years if he or she did not get any or all doses earlier.  During the 3-dose series, the second dose should be obtained 4-8 weeks after the first dose. The third dose should be obtained 24 weeks after the first dose and 16 weeks after the second dose.  Zoster vaccine.  One dose is recommended for adults aged 50 years or older unless certain conditions are present.  Measles, mumps, and rubella (MMR) vaccine.  Adults born before [birth year] generally are considered immune to measles and mumps.  Adults born in [birth year] or later should have 1 or more doses of MMR vaccine unless there is a contraindication to the vaccine or there is laboratory evidence of immunity to each of the three diseases.  A routine second dose of MMR vaccine should be obtained  at least 28 days after the first dose for students attending postsecondary schools, health care workers, or international travelers.  People who received inactivated measles vaccine or an unknown type of measles vaccine during 1963-1967 should receive 2 doses of MMR vaccine.  People who received inactivated mumps vaccine or an unknown type of mumps vaccine before 1979 and are at high risk for mumps infection should consider immunization with 2 doses of MMR vaccine.  For females of childbearing age, rubella immunity should be determined. If there is no evidence of immunity, females who are not pregnant should be vaccinated. If there is no evidence of immunity, females who are pregnant should delay immunization until after pregnancy.  Unvaccinated health care workers born before [birth year] who lack laboratory evidence of measles, mumps, or rubella immunity or laboratory confirmation of disease should consider measles and  mumps immunization with 2 doses of MMR vaccine or rubella immunization with 1 dose of MMR vaccine.  Pneumococcal 13-valent conjugate (PCV13) vaccine.  When indicated, a person who is uncertain of his or her immunization history and has no record of immunization should receive the PCV13 vaccine.  An adult aged 36 years or older who has certain medical conditions and has not been previously immunized should receive 1 dose of PCV13 vaccine. This PCV13 should be followed with a dose of pneumococcal polysaccharide (PPSV23) vaccine. The PPSV23 vaccine dose should be obtained at least 8 weeks after the dose of PCV13 vaccine.  An adult aged 10 years or older who has certain medical conditions and previously received 1 or more doses of PPSV23 vaccine should receive 1 dose of PCV13. The PCV13 vaccine dose should be obtained 1 or more years after the last PPSV23 vaccine dose.  Pneumococcal polysaccharide (PPSV23) vaccine.  When PCV13 is also indicated, PCV13 should be obtained first.  All adults aged 61 years and older should be immunized.  An adult younger than age 67 years who has certain medical conditions should be immunized.  Any person who resides in a nursing home or long-term care facility should be immunized.  An adult smoker should be immunized.  People with an immunocompromised condition and certain other conditions should receive both PCV13 and PPSV23 vaccines.  People with human immunodeficiency virus (HIV) infection should be immunized as soon as possible after diagnosis.  Immunization during chemotherapy or radiation therapy should be avoided.  Routine use of PPSV23 vaccine is not recommended for American Indians, [place] Natives, or people younger than 65 years unless there are medical conditions that require PPSV23 vaccine.  When indicated, people who have unknown immunization and have no record of immunization should receive PPSV23 vaccine.  One-time revaccination 5 years after  the first dose of PPSV23 is recommended for people aged 19-64 years who have chronic kidney failure, nephrotic syndrome, asplenia, or immunocompromised conditions.  People who received 1-2 doses of PPSV23 before age 9 years should receive another dose of PPSV23 vaccine at age 62 years or later if at least 5 years have passed since the previous dose.  Doses of PPSV23 are not needed for people immunized with PPSV23 at or after age 38 years.  Meningococcal vaccine.  Adults with asplenia or persistent complement component deficiencies should receive 2 doses of quadrivalent meningococcal conjugate (MenACWY-D) vaccine. The doses should be obtained at least 2 months apart.  Microbiologists working with certain meningococcal bacteria, [place] recruits, people at risk during an outbreak, and people who travel to or live in countries with a high rate of meningitis should  be immunized.  A first-year college student up through age 60 years who is living in a residence hall should receive a dose if he or she did not receive a dose on or after his or her 16th birthday.  Adults who have certain high-risk conditions should receive one or more doses of vaccine.  Hepatitis A vaccine.  Adults who wish to be protected from this disease, have certain high-risk conditions, work with hepatitis A-infected animals, work in hepatitis A research labs, or travel to or work in countries with a high rate of hepatitis A should be immunized.  Adults who were previously unvaccinated and who anticipate close contact with an international adoptee during the first 60 days after arrival in the [place] from a country with a high rate of hepatitis A should be immunized.  Hepatitis B vaccine.  Adults who wish to be protected from this disease, have certain high-risk conditions, may be exposed to blood or other infectious body fluids, are household contacts or sex partners of hepatitis B positive people, are clients or  workers in certain care facilities, or travel to or work in countries with a high rate of hepatitis B should be immunized.  Haemophilus influenzae type b (Hib) vaccine.  A previously unvaccinated person with asplenia or sickle cell disease or having a scheduled splenectomy should receive 1 dose of Hib vaccine.  Regardless of previous immunization, a recipient of a hematopoietic stem cell transplant should receive a 3-dose series 6-12 months after his or her successful transplant.  Hib vaccine is not recommended for adults with HIV infection. Document Released: 11/14/2003 Document Revised: 12/19/2012 Document Reviewed: 10/11/2012 [facility] Patient Information 2015 [place], [place]. This information is not intended to replace advice given to you by your health care provider. Make sure you discuss any questions you have with your health care provider.

## 2014-05-21 NOTE — Progress Notes (Signed)
   Subjective:    Patient ID: Martin [name], male    DOB: 05/15/1971, 43 y.o.   MRN: [id number]  HPI Patient is being seen for urinary frequency. No burning. He ride a motorcycle. UTI.   Review of Systems     Objective:   Physical Exam        Assessment & Plan:  Prostatitis/UTI cipro 10 d

## 2014-05-24 LAB — URINE CULTURE: Colony Count: 70000

## 2014-06-21 ENCOUNTER — Ambulatory Visit (INDEPENDENT_AMBULATORY_CARE_PROVIDER_SITE_OTHER): Payer: BC Managed Care – PPO | Admitting: *Deleted

## 2014-06-21 DIAGNOSIS — Z23 Encounter for immunization: Secondary | ICD-10-CM

## 2014-08-13 ENCOUNTER — Ambulatory Visit (INDEPENDENT_AMBULATORY_CARE_PROVIDER_SITE_OTHER): Payer: BC Managed Care – PPO | Admitting: Internal Medicine

## 2014-08-13 VITALS — BP 150/90 | HR 78 | Temp 97.6°F | Resp 18 | Ht 72.5 in | Wt 243.0 lb

## 2014-08-13 DIAGNOSIS — R109 Unspecified abdominal pain: Secondary | ICD-10-CM

## 2014-08-13 DIAGNOSIS — Z720 Tobacco use: Secondary | ICD-10-CM

## 2014-08-13 DIAGNOSIS — B9789 Other viral agents as the cause of diseases classified elsewhere: Secondary | ICD-10-CM

## 2014-08-13 DIAGNOSIS — J069 Acute upper respiratory infection, unspecified: Secondary | ICD-10-CM

## 2014-08-13 LAB — POCT URINALYSIS DIPSTICK
BILIRUBIN UA: NEGATIVE
Glucose, UA: NEGATIVE
KETONES UA: NEGATIVE
LEUKOCYTES UA: NEGATIVE
Nitrite, UA: NEGATIVE
PH UA: 7.5
PROTEIN UA: NEGATIVE
Spec Grav, UA: 1.015
Urobilinogen, UA: 0.2

## 2014-08-13 LAB — COMPREHENSIVE METABOLIC PANEL
ALBUMIN: 4.1 g/dL (ref 3.5–5.2)
ALK PHOS: 65 U/L (ref 39–117)
ALT: 15 U/L (ref 0–53)
AST: 17 U/L (ref 0–37)
BUN: 9 mg/dL (ref 6–23)
CALCIUM: 9.1 mg/dL (ref 8.4–10.5)
CHLORIDE: 102 meq/L (ref 96–112)
CO2: 26 meq/L (ref 19–32)
Creat: 0.78 mg/dL (ref 0.50–1.35)
GLUCOSE: 91 mg/dL (ref 70–99)
POTASSIUM: 3.8 meq/L (ref 3.5–5.3)
SODIUM: 136 meq/L (ref 135–145)
TOTAL PROTEIN: 6.9 g/dL (ref 6.0–8.3)
Total Bilirubin: 0.5 mg/dL (ref 0.2–1.2)

## 2014-08-13 LAB — POCT CBC
GRANULOCYTE PERCENT: 45.8 % (ref 37–80)
HEMATOCRIT: 43.1 % — AB (ref 43.5–53.7)
HEMOGLOBIN: 14.1 g/dL (ref 14.1–18.1)
Lymph, poc: 2.6 (ref 0.6–3.4)
MCH, POC: 29.5 pg (ref 27–31.2)
MCHC: 32.8 g/dL (ref 31.8–35.4)
MCV: 90 fL (ref 80–97)
MID (cbc): 0.5 (ref 0–0.9)
MPV: 6.6 fL (ref 0–99.8)
POC GRANULOCYTE: 2.6 (ref 2–6.9)
POC LYMPH %: 45.8 % (ref 10–50)
POC MID %: 8.4 %M (ref 0–12)
Platelet Count, POC: 231 10*3/uL (ref 142–424)
RBC: 4.79 M/uL (ref 4.69–6.13)
RDW, POC: 14.2 %
WBC: 5.7 10*3/uL (ref 4.6–10.2)

## 2014-08-13 LAB — POCT UA - MICROSCOPIC ONLY
BACTERIA, U MICROSCOPIC: NEGATIVE
CASTS, UR, LPF, POC: NEGATIVE
CRYSTALS, UR, HPF, POC: NEGATIVE
MUCUS UA: NEGATIVE
WBC, Ur, HPF, POC: NEGATIVE
YEAST UA: NEGATIVE

## 2014-08-13 MED ORDER — GUAIFENESIN ER 1200 MG PO TB12: 1.0000 | ORAL_TABLET | Freq: Two times a day (BID) | ORAL | Status: DC | PRN

## 2014-08-13 MED ORDER — HYDROCOD POLST-CHLORPHEN POLST 10-8 MG/5ML PO LQCR: 5.0000 mL | Freq: Two times a day (BID) | ORAL | Status: DC | PRN

## 2014-08-13 MED ORDER — MELOXICAM 15 MG PO TABS: 15.0000 mg | ORAL_TABLET | Freq: Every day | ORAL | Status: DC

## 2014-08-13 MED ORDER — IPRATROPIUM BROMIDE 0.03 % NA SOLN: 2.0000 | Freq: Two times a day (BID) | NASAL | Status: DC

## 2014-08-13 NOTE — Progress Notes (Signed)
Subjective:    Patient ID: Martin [name], male    DOB: [birth year], 43 y.o.   MRN: [id number]  HPI  This is a 43 year old male with PMH HTN and tobacco abuse presenting with URI symptoms and back pain.  URI symptoms: He has had a cough x 4 days and nasal congestion x 2 days. The cough is worse at night and is productive of green/yellow sputum. He is having some sinus pressure. He denies otalgia, sore throat, fevers, chills, SOB, wheezing. He has tried benadryl and afrin. He does not have any sick contacts. He a current every day smoker - 1 ppd. He does not have a history of asthma. He is worried because earlier in the year he had bronchitis and he doesn't want to get it again.  Back pain: He is complaining of left sided lower back pain x 1 week. He describes the pain as pressure. It is exacerbated by bending forward. He is wondering if this is a UTI. He is worried about this because his girlfriend told him she has a yeast infection. He has been with his current sexual partner for 1 year, no new partners. He is not worried about STDs and does not want to be checked. He denies dysuria, frequency, penile discharge, hematuria, N/V/D. He does not have a history of kidney stones.  Tobacco abuse: smokes 1 ppd. He wants to quit but he is not ready yet.  Review of Systems  Constitutional: Negative for fever and chills.  HENT: Positive for congestion. Negative for ear pain and sore throat.   Eyes: Negative for redness.  Respiratory: Positive for cough. Negative for shortness of breath.   Gastrointestinal: Negative for nausea, vomiting, abdominal pain and diarrhea.  Genitourinary: Negative for dysuria, frequency, hematuria, genital sores and testicular pain.  Musculoskeletal: Positive for back pain.  Skin: Negative for rash.  Neurological: Negative for headaches.  Psychiatric/Behavioral: Positive for sleep disturbance.   Patient Active Problem List   Diagnosis Date Noted  . Tobacco abuse  08/13/2014  . Scalp mass 05/31/2012  . 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 HYPERTENSION 12/20/2008  . LUNG NODULE 12/20/2008   Prior to Admission medications   Medication Sig Start Date End Date Taking? Authorizing Provider  Alum & Mag Hydroxide-Simeth (MAGIC MOUTHWASH W/LIDOCAINE) SOLN Take 10 mLs by mouth every 2 (two) hours as needed for mouth pain. 04/16/14  Yes Chelle S Jeffery, PA-C  amLODipine-benazepril (LOTREL) 5-10 MG per capsule Take 1 capsule by mouth daily.   Yes Historical Provider, MD  fenofibrate (TRICOR) 48 MG tablet Take 48 mg by mouth daily.   Yes Historical Provider, MD  HYDROcodone-acetaminophen (NORCO) 5-325 MG per tablet Take 1-2 tablets by mouth every 4 (four) hours as needed for pain. 04/17/13  Yes Coralie Keens, MD  ibuprofen (ADVIL,MOTRIN) 200 MG tablet Take 400-600 mg by mouth 3 (three) times daily as needed for pain.   Yes Historical Provider, MD  valACYclovir (VALTREX) 1000 MG tablet Take 1 tablet (1,000 mg total) by mouth daily. 12/07/12  Yes Darreld Mclean, MD                               No Known Allergies  Patient's social and family history were reviewed.     Objective:   Physical Exam  Constitutional: He is oriented to person, place, and time. He appears well-developed and well-nourished. No distress.  HENT:  Head:  Normocephalic and atraumatic.  Right Ear: Hearing, external ear and ear canal normal. Tympanic membrane is retracted.  Left Ear: Hearing, external ear and ear canal normal. Tympanic membrane is retracted.  Nose: Mucosal edema present.  Mouth/Throat: Uvula is midline and mucous membranes are normal. Posterior oropharyngeal erythema present. No oropharyngeal exudate, posterior oropharyngeal edema or tonsillar abscesses.  Eyes: Conjunctivae and lids are normal. Right eye exhibits no discharge. Left eye exhibits no discharge. No scleral icterus.  Cardiovascular: Normal rate, regular rhythm, normal heart sounds, intact  distal pulses and normal pulses.   No murmur heard. Pulmonary/Chest: Effort normal and breath sounds normal. No respiratory distress. He has no wheezes. He has no rhonchi. He has no rales.  Abdominal: Soft. Normal appearance. There is tenderness (left flank). There is no rebound, no guarding and no CVA tenderness.  Musculoskeletal: Normal range of motion.       Lumbar back: He exhibits normal range of motion, no tenderness and no bony tenderness.  Straight leg raise negative  Lymphadenopathy:       Head (right side): No submental, no submandibular, no tonsillar, no preauricular, no posterior auricular and no occipital adenopathy present.       Head (left side): No submental, no submandibular, no tonsillar, no preauricular, no posterior auricular and no occipital adenopathy present.    He has no cervical adenopathy.  Neurological: He is alert and oriented to person, place, and time. He has normal strength and normal reflexes. No sensory deficit.  Reflex Scores:      Patellar reflexes are 2+ on the right side and 2+ on the left side. Skin: Skin is warm, dry and intact. No lesion and no rash noted.  Psychiatric: He has a normal mood and affect. His speech is normal and behavior is normal. Thought content normal.   Results for orders placed or performed in visit on 08/13/14  POCT urinalysis dipstick  Result Value Ref Range   Color, UA yellow    Clarity, UA clear    Glucose, UA neg    Bilirubin, UA neg    Ketones, UA neg    Spec Grav, UA 1.015    Blood, UA trace-intact    pH, UA 7.5    Protein, UA neg    Urobilinogen, UA 0.2    Nitrite, UA neg    Leukocytes, UA Negative   POCT UA - Microscopic Only  Result Value Ref Range   WBC, Ur, HPF, POC neg    RBC, urine, microscopic 0-1    Bacteria, U Microscopic neg    Mucus, UA neg    Epithelial cells, urine per micros 0-1    Crystals, Ur, HPF, POC neg    Casts, Ur, LPF, POC neg    Yeast, UA neg   POCT CBC  Result Value Ref Range   WBC  5.7 4.6 - 10.2 K/uL   Lymph, poc 2.6 0.6 - 3.4   POC LYMPH PERCENT 45.8 10 - 50 %L   MID (cbc) 0.5 0 - 0.9   POC MID % 8.4 0 - 12 %M   POC Granulocyte 2.6 2 - 6.9   Granulocyte percent 45.8 37 - 80 %G   RBC 4.79 4.69 - 6.13 M/uL   Hemoglobin 14.1 14.1 - 18.1 g/dL   HCT, POC 43.1 (A) 43.5 - 53.7 %   MCV 90.0 80 - 97 fL   MCH, POC 29.5 27 - 31.2 pg   MCHC 32.8 31.8 - 35.4 g/dL   RDW, POC 14.2 %  Platelet Count, POC 231 142 - 424 K/uL   MPV 6.6 0 - 99.8 fL      Assessment & Plan:  1. Flank pain Pain is likely muscular in origin. UA negative for signs of infection or blood. CBC normal. CMP pending. Pt was given a 2 week course of meloxicam. He will return if not improved in 2 weeks or at any time if symptoms worsen. - POCT urinalysis dipstick - POCT UA - Microscopic Only - POCT CBC - Comprehensive metabolic panel - meloxicam (MOBIC) 15 MG tablet; Take 1 tablet (15 mg total) by mouth daily.  Dispense: 20 tablet; Refill: 0  2. Viral URI with cough This is likely viral. Focus is on supportive care, see medications prescribed below. He will return in 7-10 days if not improving.  - Guaifenesin (MUCINEX MAXIMUM STRENGTH) 1200 MG TB12; Take 1 tablet (1,200 mg total) by mouth every 12 (twelve) hours as needed.  Dispense: 14 tablet; Refill: 1 - ipratropium (ATROVENT) 0.03 % nasal spray; Place 2 sprays into both nostrils 2 (two) times daily.  Dispense: 30 mL; Refill: 0 - chlorpheniramine-HYDROcodone (TUSSIONEX PENNKINETIC ER) 10-8 MG/5ML LQCR; Take 5 mLs by mouth every 12 (twelve) hours as needed for cough (cough).  Dispense: 100 mL; Refill: 0  3. Tobacco abuse We discussed smoking cessation at length. He will consider cutting back. We discussed smoking cessation aides that he can use if/when he decides to quit.  Benjaman Pott Drenda Freeze, MHS [facility]  08/13/2014  I have participated in the care of this patient with the Advanced Practice  Provider and agree with Diagnosis and Plan as documented. Robert P. Laney Pastor, M.D.

## 2014-08-13 NOTE — Patient Instructions (Signed)
Return in 7-10 days if your symptoms are not improving. Get plenty of rest and drink plenty of fluids (64 oz) Take mucinex and nasal spray twice a day. You may use cough syrup at night to help you sleep.

## 2014-08-29 ENCOUNTER — Telehealth: Payer: Self-pay

## 2014-08-29 NOTE — Telephone Encounter (Signed)
Ernestine from [facility] called and left a message to request lab reports on pt. Returned her call and asked that she send [place] a written request for records.

## 2014-09-07 ENCOUNTER — Emergency Department (HOSPITAL_COMMUNITY)
Admission: EM | Admit: 2014-09-07 | Discharge: 2014-09-08 | Disposition: A | Discharge status: Home / Self Care | Payer: BC Managed Care – PPO | Attending: Emergency Medicine | Admitting: Emergency Medicine

## 2014-09-07 ENCOUNTER — Encounter (HOSPITAL_COMMUNITY): Payer: Self-pay | Admitting: Emergency Medicine

## 2014-09-07 DIAGNOSIS — Z79899 Other long term (current) drug therapy: Secondary | ICD-10-CM | POA: Diagnosis not present

## 2014-09-07 DIAGNOSIS — E785 Hyperlipidemia, unspecified: Secondary | ICD-10-CM | POA: Diagnosis not present

## 2014-09-07 DIAGNOSIS — I10 Essential (primary) hypertension: Secondary | ICD-10-CM | POA: Diagnosis not present

## 2014-09-07 DIAGNOSIS — N390 Urinary tract infection, site not specified: Secondary | ICD-10-CM | POA: Diagnosis not present

## 2014-09-07 DIAGNOSIS — Z720 Tobacco use: Secondary | ICD-10-CM | POA: Insufficient documentation

## 2014-09-07 DIAGNOSIS — Z8619 Personal history of other infectious and parasitic diseases: Secondary | ICD-10-CM | POA: Insufficient documentation

## 2014-09-07 DIAGNOSIS — R1030 Lower abdominal pain, unspecified: Secondary | ICD-10-CM | POA: Diagnosis present

## 2014-09-07 DIAGNOSIS — Z791 Long term (current) use of non-steroidal anti-inflammatories (NSAID): Secondary | ICD-10-CM | POA: Insufficient documentation

## 2014-09-07 NOTE — ED Notes (Signed)
Pt reports that he was seen at the beginning of November for groin pain, dx with prostatitis. Pt reports that since Wednesday, he has had the same pain. Pt reports burning with urination. Pt denies any swelling, or pain increased to one side.

## 2014-09-08 LAB — URINALYSIS, ROUTINE W REFLEX MICROSCOPIC
Bilirubin Urine: NEGATIVE
Glucose, UA: NEGATIVE mg/dL
Ketones, ur: NEGATIVE mg/dL
NITRITE: POSITIVE — AB
PROTEIN: 30 mg/dL — AB
SPECIFIC GRAVITY, URINE: 1.004 — AB (ref 1.005–1.030)
UROBILINOGEN UA: 0.2 mg/dL (ref 0.0–1.0)
pH: 6 (ref 5.0–8.0)

## 2014-09-08 LAB — URINE MICROSCOPIC-ADD ON

## 2014-09-08 MED ORDER — SULFAMETHOXAZOLE-TRIMETHOPRIM 800-160 MG PO TABS
1.0000 | ORAL_TABLET | Freq: Two times a day (BID) | ORAL | Status: DC
Start: 2014-09-08 — End: 2016-02-21

## 2014-09-08 MED ORDER — OXYCODONE-ACETAMINOPHEN 5-325 MG PO TABS: 1.0000 | ORAL_TABLET | ORAL | Status: DC | PRN

## 2014-09-08 NOTE — Discharge Instructions (Signed)

## 2014-09-08 NOTE — ED Notes (Signed)
Nehemiah Settle EDPA made aware re pt's elevated BP and okayed for pt to be discharged.

## 2014-09-08 NOTE — ED Provider Notes (Signed)
CSN: [id number]     Arrival date & time 09/07/14  2233 History   First MD Initiated Contact with Patient 09/08/14 0205     Chief Complaint  Patient presents with  . Groin Pain     (Consider location/radiation/quality/duration/timing/severity/associated sxs/prior Treatment) Patient is a 44 y.o. male presenting with groin pain. The history is provided by the patient. No language interpreter was used.  Groin Pain This is a new problem. The current episode started in the past 7 days. Associated symptoms include abdominal pain. Pertinent negatives include no chills or fever. Associated symptoms comments: Lower abdominal pain and "pain deep inside my groin" without testicular pain for the past 3-4 days. He has had a recent diagnosis of prostatitis and feels symptoms are similar. No fever, vomiting, rectal pain or hematuria. .    Past Medical History  Diagnosis Date  . Hypertension   . HSV (herpes simplex virus) infection   . Hyperlipidemia    Past Surgical History  Procedure Laterality Date  . Ankle surgery  02/14/2008    Right Leg/Right Ankle  . Appendectomy    . Ear cyst excision N/A 04/17/2013    Procedure: CYST REMOVAL ON SCALP X2, BACK;  Surgeon: Harl Bowie, MD;  Location: [facility];  Service: General;  Laterality: N/A;  head and back   History reviewed. No pertinent family history. History  Substance Use Topics  . Smoking status: Current Every Day Smoker -- 0.50 packs/day for 12 years    Types: Cigarettes  . Smokeless tobacco: Never Used  . Alcohol Use: Yes     Comment: Twice/week.    Review of Systems  Constitutional: Negative for fever and chills.  HENT: Negative.   Respiratory: Negative.   Cardiovascular: Negative.   Gastrointestinal: Positive for abdominal pain. Negative for rectal pain.  Genitourinary: Positive for dysuria. Negative for discharge and testicular pain.  Musculoskeletal: Negative.   Skin: Negative.   Neurological: Negative.       Allergies   Review of patient's allergies indicates no known allergies.  Home Medications   Prior to Admission medications   Medication Sig Start Date End Date Taking? Authorizing Provider  amLODipine-benazepril (LOTREL) 5-10 MG per capsule Take 1 capsule by mouth daily.   Yes Historical Provider, MD  ipratropium (ATROVENT) 0.03 % nasal spray Place 2 sprays into both nostrils 2 (two) times daily. 08/13/14  Yes Bennett Scrape V, PA-C  meloxicam (MOBIC) 15 MG tablet Take 1 tablet (15 mg total) by mouth daily. 08/13/14  Yes Bennett Scrape V, PA-C  valACYclovir (VALTREX) 1000 MG tablet Take 1 tablet (1,000 mg total) by mouth daily. 12/07/12  Yes Gay Filler Copland, MD  Alum & Mag Hydroxide-Simeth (MAGIC MOUTHWASH W/LIDOCAINE) SOLN Take 10 mLs by mouth every 2 (two) hours as needed for mouth pain. Patient not taking: Reported on 09/08/2014 04/16/14   Fara Chute, PA-C  chlorpheniramine-HYDROcodone (TUSSIONEX PENNKINETIC ER) 10-8 MG/5ML LQCR Take 5 mLs by mouth every 12 (twelve) hours as needed for cough (cough). Patient not taking: Reported on 09/08/2014 08/13/14   Ezekiel Slocumb, PA-C  fenofibrate (TRICOR) 48 MG tablet Take 48 mg by mouth daily.    Historical Provider, MD  Guaifenesin (MUCINEX MAXIMUM STRENGTH) 1200 MG TB12 Take 1 tablet (1,200 mg total) by mouth every 12 (twelve) hours as needed. Patient taking differently: Take 1 tablet by mouth 2 (two) times daily as needed.  08/13/14   Ezekiel Slocumb, PA-C  HYDROcodone-acetaminophen (NORCO) 5-325 MG per tablet Take 1-2 tablets by  mouth every 4 (four) hours as needed for pain. Patient not taking: Reported on 09/08/2014 04/17/13   Coralie Keens, MD  ibuprofen (ADVIL,MOTRIN) 200 MG tablet Take 400-600 mg by mouth 3 (three) times daily as needed for pain.    Historical Provider, MD  oxyCODONE-acetaminophen (PERCOCET/ROXICET) 5-325 MG per tablet Take 1-2 tablets by mouth every 4 (four) hours as needed for moderate pain or severe pain. 09/08/14   Jamahl Lemmons A Fallon Haecker, PA-C   sulfamethoxazole-trimethoprim (SEPTRA DS) 800-160 MG per tablet Take 1 tablet by mouth every 12 (twelve) hours. 09/08/14   Quamel Fitzmaurice A Maxie Debose, PA-C   BP 148/99 mmHg  Pulse 74  Temp(Src) 98.1 F (36.7 C) (Oral)  Resp 20  Ht 6' (1.829 m)  Wt 232 lb (105.235 kg)  BMI 31.46 kg/m2  SpO2 100% Physical Exam  Constitutional: He is oriented to person, place, and time. He appears well-developed and well-nourished.  HENT:  Head: Normocephalic.  Neck: Normal range of motion. Neck supple.  Cardiovascular: Normal rate and regular rhythm.   Pulmonary/Chest: Effort normal and breath sounds normal.  Abdominal: Soft. Bowel sounds are normal. There is no tenderness. There is no rebound and no guarding.  Genitourinary:  The patient declined genitorectal exam.   Musculoskeletal: Normal range of motion.  Neurological: He is alert and oriented to person, place, and time.  Skin: Skin is warm and dry. No rash noted.  Psychiatric: He has a normal mood and affect.    ED Course  Procedures (including critical care time) Labs Review Labs Reviewed  URINALYSIS, ROUTINE W REFLEX MICROSCOPIC - Abnormal; Notable for the following:    APPearance CLOUDY (*)    Specific Gravity, Urine 1.004 (*)    Hgb urine dipstick LARGE (*)    Protein, ur 30 (*)    Nitrite POSITIVE (*)    Leukocytes, UA LARGE (*)    All other components within normal limits  URINE MICROSCOPIC-ADD ON    Imaging Review No results found.   EKG Interpretation None      MDM   Final diagnoses:  UTI (lower urinary tract infection)    He has recurrent symptoms of UTI/prostatitis. Afebrile. Will refer to urology. Septra DS x 14 days, pain management. Stable for discharge.     Dewaine Oats, PA-C 09/08/14 [place], MD 09/08/14 [phone]

## 2014-09-08 NOTE — ED Notes (Signed)
Pt reports pain in his groin area.  Pt reports being seen for same in November with dx of prostatitis.  Pt reports pain after urinating.  Denies swelling or redness in either testicle at this time.

## 2014-11-20 ENCOUNTER — Ambulatory Visit (INDEPENDENT_AMBULATORY_CARE_PROVIDER_SITE_OTHER): Payer: BC Managed Care – PPO | Admitting: Family Medicine

## 2014-11-20 DIAGNOSIS — Z23 Encounter for immunization: Secondary | ICD-10-CM

## 2014-11-20 NOTE — Progress Notes (Signed)
   Subjective:    Patient ID: Martin [name], male    DOB: 07-07-1971, 44 y.o.   MRN: [id number]  HPI Patient here today for HEPB only. This is his 3rd injection.   Review of Systems     Objective:   Physical Exam        Assessment & Plan:

## 2015-03-29 ENCOUNTER — Encounter (HOSPITAL_COMMUNITY): Payer: Self-pay

## 2015-03-29 ENCOUNTER — Other Ambulatory Visit: Payer: Self-pay | Admitting: Internal Medicine

## 2015-03-29 ENCOUNTER — Ambulatory Visit (HOSPITAL_COMMUNITY)
Admission: RE | Admit: 2015-03-29 | Discharge: 2015-03-29 | Disposition: A | Discharge status: Home / Self Care | Payer: BC Managed Care – PPO | Source: Ambulatory Visit | Attending: Internal Medicine | Admitting: Internal Medicine

## 2015-03-29 ENCOUNTER — Ambulatory Visit (INDEPENDENT_AMBULATORY_CARE_PROVIDER_SITE_OTHER): Payer: BC Managed Care – PPO

## 2015-03-29 ENCOUNTER — Encounter: Payer: Self-pay | Admitting: Internal Medicine

## 2015-03-29 ENCOUNTER — Ambulatory Visit (INDEPENDENT_AMBULATORY_CARE_PROVIDER_SITE_OTHER): Payer: BC Managed Care – PPO | Admitting: Internal Medicine

## 2015-03-29 VITALS — BP 152/104 | HR 84 | Temp 97.8°F | Resp 18 | Ht 72.0 in | Wt 244.0 lb

## 2015-03-29 DIAGNOSIS — K5792 Diverticulitis of intestine, part unspecified, without perforation or abscess without bleeding: Secondary | ICD-10-CM | POA: Insufficient documentation

## 2015-03-29 DIAGNOSIS — I10 Essential (primary) hypertension: Secondary | ICD-10-CM | POA: Diagnosis not present

## 2015-03-29 DIAGNOSIS — R1032 Left lower quadrant pain: Secondary | ICD-10-CM | POA: Diagnosis present

## 2015-03-29 DIAGNOSIS — D649 Anemia, unspecified: Secondary | ICD-10-CM | POA: Diagnosis not present

## 2015-03-29 DIAGNOSIS — D62 Acute posthemorrhagic anemia: Secondary | ICD-10-CM

## 2015-03-29 LAB — POCT URINALYSIS DIPSTICK
Bilirubin, UA: NEGATIVE
GLUCOSE UA: NEGATIVE
Ketones, UA: NEGATIVE
LEUKOCYTES UA: NEGATIVE
NITRITE UA: NEGATIVE
Protein, UA: NEGATIVE
Spec Grav, UA: 1.01
Urobilinogen, UA: 0.2
pH, UA: 6

## 2015-03-29 LAB — POCT CBC
Granulocyte percent: 38.6 %G (ref 37–80)
HEMATOCRIT: 22.2 % — AB (ref 43.5–53.7)
HEMOGLOBIN: 7.6 g/dL — AB (ref 14.1–18.1)
Lymph, poc: 4 — AB (ref 0.6–3.4)
MCH, POC: 30 pg (ref 27–31.2)
MCHC: 34.2 g/dL (ref 31.8–35.4)
MCV: 87.9 fL (ref 80–97)
MID (cbc): 0.2 (ref 0–0.9)
MPV: 7 fL (ref 0–99.8)
POC Granulocyte: 2.7 (ref 2–6.9)
POC LYMPH %: 58 % — AB (ref 10–50)
POC MID %: 3.4 %M (ref 0–12)
Platelet Count, POC: 332 10*3/uL (ref 142–424)
RBC: 2.52 M/uL — AB (ref 4.69–6.13)
RDW, POC: 14.4 %
WBC: 6.9 10*3/uL (ref 4.6–10.2)

## 2015-03-29 LAB — POCT UA - MICROSCOPIC ONLY
Bacteria, U Microscopic: NEGATIVE
CRYSTALS, UR, HPF, POC: NEGATIVE
Casts, Ur, LPF, POC: NEGATIVE
EPITHELIAL CELLS, URINE PER MICROSCOPY: NEGATIVE
Mucus, UA: POSITIVE
YEAST UA: NEGATIVE

## 2015-03-29 LAB — COMPREHENSIVE METABOLIC PANEL
ALT: 10 U/L (ref 0–53)
AST: 12 U/L (ref 0–37)
Albumin: 3.9 g/dL (ref 3.5–5.2)
Alkaline Phosphatase: 57 U/L (ref 39–117)
BUN: 11 mg/dL (ref 6–23)
CALCIUM: 8.8 mg/dL (ref 8.4–10.5)
CHLORIDE: 103 meq/L (ref 96–112)
CO2: 26 meq/L (ref 19–32)
CREATININE: 0.75 mg/dL (ref 0.50–1.35)
GLUCOSE: 86 mg/dL (ref 70–99)
POTASSIUM: 4.3 meq/L (ref 3.5–5.3)
Sodium: 138 mEq/L (ref 135–145)
Total Bilirubin: 0.4 mg/dL (ref 0.2–1.2)
Total Protein: 6.7 g/dL (ref 6.0–8.3)

## 2015-03-29 LAB — IFOBT (OCCULT BLOOD): IFOBT: NEGATIVE

## 2015-03-29 MED ORDER — IOHEXOL 300 MG/ML  SOLN
100.0000 mL | Freq: Once | INTRAMUSCULAR | Status: AC | PRN
  Administered 2015-03-29: 100 mL via INTRAVENOUS

## 2015-03-29 MED ORDER — IOHEXOL 300 MG/ML  SOLN
25.0000 mL | INTRAMUSCULAR | Status: AC
  Administered 2015-03-29: 25 mL via ORAL

## 2015-03-29 MED ORDER — ACETAMINOPHEN-CODEINE 120-12 MG/5ML PO SOLN: 15.0000 mL | Freq: Once | ORAL | Status: DC

## 2015-03-29 NOTE — Progress Notes (Addendum)
Subjective:  This chart was scribed for Martin Lin, MD by Leandra Kern, Medical Scribe. This patient was seen in Room 11 and the patient's care was started at 2:26 PM.   Patient ID: Martin [name], male    DOB: 1971-02-14, 44 y.o.   MRN: [id number]  HPI HPI Comments: Martin [name] is a 44 y.o. male who presents to [facility] complaining of a stabbing abdominal pain, sudden onset 5 days ago. Left lower quadrant. Pt notes that eating and putting pressure on the area make the pain worse.  Pt denies dysuria, diarrhea, vomiting, blood in stool, or cough.  No prior history of abdominal problems except for 1 prior event several years ago called diverticulitis. No recent weight loss. Last recorded illness was acute prostatitis treated in the emergency room wintertime 2016-he did not follow-up with referral to urology  Patient Active Problem List   Diagnosis Date Noted  . Tobacco abuse 08/13/2014  . HYPERTENSION 12/20/2008  . LUNG NODULE--- Told was stable after CT scan --- no follow-up  12/20/2008    -  Overweight  No current NSAIDs Current meds Lo-Trel 5/10 , TriCor   Review of Systems  Constitutional: Negative for fever, activity change, fatigue and unexpected weight change.  Respiratory: Negative for cough and chest tightness.   Cardiovascular: Negative for chest pain, palpitations and leg swelling.  Gastrointestinal: Positive for abdominal pain. Negative for vomiting, diarrhea, constipation, blood in stool, abdominal distention and anal bleeding.  Genitourinary: Negative for dysuria, urgency, frequency, hematuria, flank pain, decreased urine volume, scrotal swelling, penile pain and testicular pain.      Objective:   Physical Exam  Constitutional: He is oriented to person, place, and time. He appears well-developed and well-nourished. No distress.  HENT:  Head: Normocephalic and atraumatic.  Eyes: EOM are normal. Pupils are equal, round, and reactive to  light.  Neck: Neck supple.  Cardiovascular: Normal rate.   Pulmonary/Chest: Effort normal.  Abdominal: Soft. He exhibits no distension and no mass. There is tenderness. There is guarding. There is no rebound.  Tender to palpation in the left lower quadrant without percussion or rebound and with no appreciable mass, although he is obese  Genitourinary:  No rectal masses. Prostate soft and symmetrical without nodules. Stool brown. Heme occult negative in the office  Neurological: He is alert and oriented to person, place, and time. No cranial nerve deficit.  Skin: Skin is warm and dry.  Psychiatric: He has a normal mood and affect. His behavior is normal.  Nursing note and vitals reviewed.  BP 152/104 mmHg  Pulse 84  Temp(Src) 97.8 F (36.6 C)  Resp 18  Ht 6' (1.829 m)  Wt 244 lb (110.678 kg)  BMI 33.09 kg/m2  SpO2 98% [facility] reading (PRIMARY) by  Dr. Arushi Partridge= KUB shows no acute obstruction or mass lesions      Assessment & Plan:  I have completed the patient encounter in its entirety as documented by the scribe, with editing by me where necessary. Ron Beske P. Laney Pastor, M.D.  LLQ abdominal pain - Plan:  Comprehensive metabolic panel,  CT Abdomen Pelvis W Contrast  ?Acute vs chronic blood loss anemia: Normocytic - Plan:TSH,ESR  Essential hypertension-not controlled  Given 15 mL of Lortab elixir in the office and sent for CT scan  Results called at 8 PM from [place]  acute diverticulitis so meds called into [facility] [place] Cipro 500 twice a day #20, Flagyl 500 twice a day the #14  and tramadol 1-2 every 6 hours #20  We will plan follow-up testing for anemia next week--- or have him follow-up with his PCP [facility].

## 2015-03-30 ENCOUNTER — Telehealth: Payer: Self-pay | Admitting: *Deleted

## 2015-03-30 LAB — TSH: TSH: 0.592 u[IU]/mL (ref 0.350–4.500)

## 2015-03-30 LAB — SEDIMENTATION RATE

## 2015-03-30 NOTE — Telephone Encounter (Signed)
[facility] lab called stating not enough blood for sed rate. Patient needs a redraw

## 2015-03-30 NOTE — Telephone Encounter (Signed)
No need for sedimentation rate since we found the etiology with CT scan

## 2015-04-02 LAB — PSA: PSA: 0.74 ng/mL (ref <=4.00)

## 2015-04-09 ENCOUNTER — Other Ambulatory Visit: Payer: Self-pay | Admitting: Family Medicine

## 2015-04-09 DIAGNOSIS — D649 Anemia, unspecified: Secondary | ICD-10-CM

## 2016-02-21 ENCOUNTER — Ambulatory Visit (INDEPENDENT_AMBULATORY_CARE_PROVIDER_SITE_OTHER): Payer: BC Managed Care – PPO | Admitting: Urgent Care

## 2016-02-21 ENCOUNTER — Ambulatory Visit (INDEPENDENT_AMBULATORY_CARE_PROVIDER_SITE_OTHER): Payer: BC Managed Care – PPO

## 2016-02-21 VITALS — BP 132/90 | HR 86 | Temp 98.4°F | Resp 15 | Ht 72.0 in | Wt 241.0 lb

## 2016-02-21 DIAGNOSIS — Z8719 Personal history of other diseases of the digestive system: Secondary | ICD-10-CM

## 2016-02-21 DIAGNOSIS — R195 Other fecal abnormalities: Secondary | ICD-10-CM

## 2016-02-21 DIAGNOSIS — E860 Dehydration: Secondary | ICD-10-CM

## 2016-02-21 DIAGNOSIS — R319 Hematuria, unspecified: Secondary | ICD-10-CM

## 2016-02-21 DIAGNOSIS — K5900 Constipation, unspecified: Secondary | ICD-10-CM

## 2016-02-21 DIAGNOSIS — R109 Unspecified abdominal pain: Secondary | ICD-10-CM | POA: Diagnosis not present

## 2016-02-21 LAB — COMPLETE METABOLIC PANEL WITH GFR
ALBUMIN: 4.3 g/dL (ref 3.6–5.1)
ALK PHOS: 56 U/L (ref 40–115)
ALT: 14 U/L (ref 9–46)
AST: 13 U/L (ref 10–40)
BUN: 15 mg/dL (ref 7–25)
CHLORIDE: 103 mmol/L (ref 98–110)
CO2: 30 mmol/L (ref 20–31)
Calcium: 9.3 mg/dL (ref 8.6–10.3)
Creat: 0.83 mg/dL (ref 0.60–1.35)
GFR, Est African American: >89 mL/min (ref >=60)
GLUCOSE: 96 mg/dL (ref 65–99)
POTASSIUM: 4.3 mmol/L (ref 3.5–5.3)
SODIUM: 139 mmol/L (ref 135–146)
Total Bilirubin: 0.6 mg/dL (ref 0.2–1.2)
Total Protein: 7.6 g/dL (ref 6.1–8.1)

## 2016-02-21 LAB — POCT CBC
GRANULOCYTE PERCENT: 63.1 % (ref 37–80)
HEMATOCRIT: 43 % — AB (ref 43.5–53.7)
Hemoglobin: 14.8 g/dL (ref 14.1–18.1)
Lymph, poc: 2.8 (ref 0.6–3.4)
MCH, POC: 29.8 pg (ref 27–31.2)
MCHC: 34.4 g/dL (ref 31.8–35.4)
MCV: 86.5 fL (ref 80–97)
MID (cbc): 0.8 (ref 0–0.9)
MPV: 6.4 fL (ref 0–99.8)
PLATELET COUNT, POC: 270 10*3/uL (ref 142–424)
POC Granulocyte: 6.2 (ref 2–6.9)
POC LYMPH %: 28.2 % (ref 10–50)
POC MID %: 8.7 %M (ref 0–12)
RBC: 4.97 M/uL (ref 4.69–6.13)
RDW, POC: 14 %
WBC: 9.8 10*3/uL (ref 4.6–10.2)

## 2016-02-21 LAB — POC MICROSCOPIC URINALYSIS (UMFC)

## 2016-02-21 LAB — POCT URINALYSIS DIP (MANUAL ENTRY)
BILIRUBIN UA: NEGATIVE
BILIRUBIN UA: NEGATIVE
GLUCOSE UA: NEGATIVE
LEUKOCYTES UA: NEGATIVE
NITRITE UA: NEGATIVE
Protein Ur, POC: NEGATIVE
Spec Grav, UA: >=1.03
Urobilinogen, UA: 0.2
pH, UA: 5.5

## 2016-02-21 MED ORDER — OXYCODONE-ACETAMINOPHEN 5-325 MG PO TABS: 1.0000 | ORAL_TABLET | Freq: Three times a day (TID) | ORAL | Status: DC | PRN

## 2016-02-21 MED ORDER — TAMSULOSIN HCL 0.4 MG PO CAPS: 0.4000 mg | ORAL_CAPSULE | Freq: Every day | ORAL | Status: DC

## 2016-02-21 MED ORDER — POLYETHYLENE GLYCOL 3350 17 GM/SCOOP PO POWD: 17.0000 g | Freq: Every day | ORAL | Status: DC | PRN

## 2016-02-21 MED ORDER — DOCUSATE SODIUM 50 MG PO CAPS: 50.0000 mg | ORAL_CAPSULE | Freq: Two times a day (BID) | ORAL | Status: DC

## 2016-02-21 NOTE — Progress Notes (Signed)
MRN: [id number] DOB: 06-Apr-1971  Subjective:   Martin [name] is a 45 y.o. male presenting for chief complaint of Abdominal Pain and Nausea  Reports 2 week history of intermittent abdominal pain, worst in the past 2 days. Pain is achy, crampy in nature, worse when laying down. Also has nausea, loose stools, subjective fever (99.39F), chills, sweats. Has tried hydrocodone, Alleve, naproxen with temporary relief. Has a history of diverticulitis, last episode was 03/2015, resolved with antibiotic course. Denies constipation, vomiting, bloody stools, chest pain. Tries to eat healthily. Smokes 3/4 ppd, drinks 4 alcohol drinks per week.  [facility] has a current medication list which includes the following prescription(s): ibuprofen, valacyclovir, amlodipine-benazepril, and fenofibrate. Also has No Known Allergies.  [place]  has a past medical history of Hypertension; HSV (herpes simplex virus) infection; and Hyperlipidemia. Also  has past surgical history that includes Ankle surgery (02/14/2008); Appendectomy; and Ear Cyst Excision (N/A, 04/17/2013).  Objective:   Vitals: BP 132/90 mmHg  Pulse 86  Temp(Src) 98.4 F (36.9 C) (Oral)  Resp 15  Ht 6' (1.829 m)  Wt 241 lb (109.317 kg)  BMI 32.68 kg/m2  SpO2 96%  Physical Exam  Constitutional: He is oriented to person, place, and time. He appears well-developed and well-nourished.  HENT:  Mouth/Throat: Oropharynx is clear and moist.  Eyes: No scleral icterus.  Cardiovascular: Normal rate, regular rhythm and intact distal pulses.  Exam reveals no gallop and no friction rub.   No murmur heard. Pulmonary/Chest: No respiratory distress. He has no wheezes. He has no rales.  Abdominal: Soft. Bowel sounds are normal. He exhibits no distension and no mass. There is tenderness (left-sided, right lower quadrant).  Neurological: He is alert and oriented to person, place, and time.  Skin: Skin is warm and dry.   Dg Abd 1 View  02/21/2016  CLINICAL DATA:   Abdominal pain EXAM: ABDOMEN - 1 VIEW COMPARISON:  CT abdomen and pelvis March 29, 2015 FINDINGS: There is fairly diffuse stool throughout the colon. There is no bowel dilatation or air-fluid level suggesting obstruction. No free air. There are calcifications in the pelvis, likely phleboliths. IMPRESSION: No bowel obstruction or free air evident on this supine examination. Fairly diffuse stool throughout colon. Electronically Signed   By: Lowella Grip III M.D.   On: 02/21/2016 15:23    Results for orders placed or performed in visit on 02/21/16 (from the past 24 hour(s))  POCT CBC     Status: Abnormal   Collection Time: 02/21/16  3:03 PM  Result Value Ref Range   WBC 9.8 4.6 - 10.2 K/uL   Lymph, poc 2.8 0.6 - 3.4   POC LYMPH PERCENT 28.2 10 - 50 %L   MID (cbc) 0.8 0 - 0.9   POC MID % 8.7 0 - 12 %M   POC Granulocyte 6.2 2 - 6.9   Granulocyte percent 63.1 37 - 80 %G   RBC 4.97 4.69 - 6.13 M/uL   Hemoglobin 14.8 14.1 - 18.1 g/dL   HCT, POC 43.0 (A) 43.5 - 53.7 %   MCV 86.5 80 - 97 fL   MCH, POC 29.8 27 - 31.2 pg   MCHC 34.4 31.8 - 35.4 g/dL   RDW, POC 14.0 %   Platelet Count, POC 270 142 - 424 K/uL   MPV 6.4 0 - 99.8 fL  POCT urinalysis dipstick     Status: Abnormal   Collection Time: 02/21/16  3:03 PM  Result Value Ref Range   Color,  UA yellow yellow   Clarity, UA clear clear   Glucose, UA negative negative   Bilirubin, UA negative negative   Ketones, POC UA negative negative   Spec Grav, UA >=1.030    Blood, UA moderate (A) negative   pH, UA 5.5    Protein Ur, POC negative negative   Urobilinogen, UA 0.2    Nitrite, UA Negative Negative   Leukocytes, UA Negative Negative  POCT Microscopic Urinalysis ([facility])     Status: Abnormal   Collection Time: 02/21/16  3:03 PM  Result Value Ref Range   WBC,UR,HPF,POC None None WBC/hpf   RBC,UR,HPF,POC Too numerous to count  (A) None RBC/hpf   Bacteria Few (A) None, Too numerous to count   Mucus Present (A) Absent   Epithelial Cells,  UR Per Microscopy Few (A) None, Too numerous to count cells/hpf   Assessment and Plan :   1. Abdominal pain, unspecified abdominal location 2. Hematuria 3. Loose stools 4. Constipation, unspecified constipation type 5. History of diverticulitis of colon - Patient refused abdominal CT. I discussed risks of having untreated diverticulitis and needing the CT, patient verbalized understanding. He prefers to manage this as a possible renal stone and constipation. We will manage this for 3 days and if no improvement, rtc for recheck and belly CT.   Jaynee Eagles, PA-C [facility] [phone] 02/21/2016 2:42 PM

## 2016-02-21 NOTE — Patient Instructions (Addendum)
If you start to get fevers, nausea, vomiting, worsening belly pain, blood in your stools, you may have diverticulitis. You should return if these symptoms develop or at the very least call so we can try to schedule an abdominal CT scan. Otherwise, please strain your urine to see if you pass a kidney stone. Make sure you hydrate aggressively, at least 2 liters of water daily. Return to our clinic if you stop urinating or start seeing frank blood in your urine, painful urination. We may need to refer you to a urologist in this case or see if we should order a CT for kidney stones. Also, we will try to manage your stool burden for the next 3 days but if your symptoms persist, we need to get a belly CT on Monday 02/24/2016.   Renal Colic Renal colic is pain that is caused by passing a kidney stone. The pain can be sharp and severe. It may be felt in the back, abdomen, side (flank), or groin. It can cause nausea. Renal colic can come and go. HOME CARE INSTRUCTIONS Watch your condition for any changes. The following actions may help to lessen any discomfort that you are feeling:  Take medicines only as directed by your health care provider.  Ask your health care provider if it is okay to take over-the-counter pain medicine.  Drink enough fluid to keep your urine clear or pale yellow. Drink 6-8 glasses of water each day.  Limit the amount of salt that you eat to less than 2 grams per day.  Reduce the amount of protein in your diet. Eat less meat, fish, nuts, and dairy.  Avoid foods such as spinach, rhubarb, nuts, or bran. These may make kidney stones more likely to form. SEEK MEDICAL CARE IF:  You have a fever or chills.  Your urine smells bad or looks cloudy.  You have pain or burning when you pass urine. SEEK IMMEDIATE MEDICAL CARE IF:  Your flank pain or groin pain suddenly worsens.  You become confused or disoriented or you lose consciousness.   This information is not intended to  replace advice given to you by your health care provider. Make sure you discuss any questions you have with your health care provider.   Document Released: 06/03/2005 Document Revised: 09/14/2014 Document Reviewed: 07/04/2014 Elsevier Interactive Patient Education 2016 [employer].    To help reduce constipation and promote bowel health: 1. Drink at least 64 ounces of water each day 2. Eat plenty of fiber (fruits, vegetables, whole grains, legumes). You may use a fiber supplement. 3. Be physically active or exercise including walking, jogging, swimming, yoga, etc. 4. For active constipation use a stool softener (docusate) or an osmotic laxative (like Miralax) each day, or as needed   Please pick up Miralax for moderate to severe constipation. Take this once a day for the next 3 days. Please also start docusate stool softener, twice a day for at least 1 week. If stools become loose, cut down to once a day for the 2nd week. If stools remain loose, cut back to 1 pill every other day for the 3rd week. You can stop docusate thereafter and resume as needed for constipation.     IF you received an x-ray today, you will receive an invoice from [facility]. Please contact [facility] at [phone] with questions or concerns regarding your invoice.   IF you received labwork today, you will receive an invoice from [employer]. Please contact [facility] at  [phone] with questions or concerns regarding your invoice.   Our billing staff will not be able to assist you with questions regarding bills from these companies.  You will be contacted with the lab results as soon as they are available. The fastest way to get your results is to activate your My Chart account. Instructions are located on the last page of this paperwork. If you have not heard from [place] regarding the results in 2 weeks, please contact this office.

## 2016-02-22 LAB — URINE CULTURE
COLONY COUNT: NO GROWTH
Organism ID, Bacteria: NO GROWTH

## 2016-02-24 ENCOUNTER — Ambulatory Visit (INDEPENDENT_AMBULATORY_CARE_PROVIDER_SITE_OTHER): Payer: BC Managed Care – PPO

## 2016-02-24 ENCOUNTER — Ambulatory Visit (INDEPENDENT_AMBULATORY_CARE_PROVIDER_SITE_OTHER): Payer: BC Managed Care – PPO | Admitting: Urgent Care

## 2016-02-24 VITALS — BP 130/92 | HR 79 | Temp 98.6°F | Resp 16 | Wt 245.8 lb

## 2016-02-24 DIAGNOSIS — Z8719 Personal history of other diseases of the digestive system: Secondary | ICD-10-CM

## 2016-02-24 DIAGNOSIS — R1084 Generalized abdominal pain: Secondary | ICD-10-CM

## 2016-02-24 DIAGNOSIS — Z3169 Encounter for other general counseling and advice on procreation: Secondary | ICD-10-CM

## 2016-02-24 DIAGNOSIS — I10 Essential (primary) hypertension: Secondary | ICD-10-CM | POA: Diagnosis not present

## 2016-02-24 DIAGNOSIS — K5900 Constipation, unspecified: Secondary | ICD-10-CM

## 2016-02-24 DIAGNOSIS — R319 Hematuria, unspecified: Secondary | ICD-10-CM

## 2016-02-24 LAB — POCT CBC
GRANULOCYTE PERCENT: 41.7 % (ref 37–80)
HCT, POC: 40.3 % — AB (ref 43.5–53.7)
HEMOGLOBIN: 14.2 g/dL (ref 14.1–18.1)
Lymph, poc: 3.1 (ref 0.6–3.4)
MCH: 30.5 pg (ref 27–31.2)
MCHC: 35.3 g/dL (ref 31.8–35.4)
MCV: 86.6 fL (ref 80–97)
MID (cbc): 0.6 (ref 0–0.9)
MPV: 6.2 fL (ref 0–99.8)
PLATELET COUNT, POC: 293 10*3/uL (ref 142–424)
POC Granulocyte: 2.7 (ref 2–6.9)
POC LYMPH PERCENT: 48.7 %L (ref 10–50)
POC MID %: 9.6 %M (ref 0–12)
RBC: 4.65 M/uL — AB (ref 4.69–6.13)
RDW, POC: 13.9 %
WBC: 6.4 10*3/uL (ref 4.6–10.2)

## 2016-02-24 LAB — POCT URINALYSIS DIP (MANUAL ENTRY)
BILIRUBIN UA: NEGATIVE
GLUCOSE UA: NEGATIVE
Ketones, POC UA: NEGATIVE
Leukocytes, UA: NEGATIVE
NITRITE UA: NEGATIVE
PROTEIN UA: NEGATIVE
SPEC GRAV UA: 1.02
Urobilinogen, UA: 0.2
pH, UA: 5.5

## 2016-02-24 LAB — POC MICROSCOPIC URINALYSIS (UMFC): Mucus: ABSENT

## 2016-02-24 NOTE — Progress Notes (Signed)
MRN: [id number] DOB: [birth year]  Subjective:   Martin [name] is a 45 y.o. male presenting for follow up on abdominal pain.   Abdominal Pain - Patient was last seen on 02/21/2016 for belly pain. He was started on treatment for possible renal stone, constipation. He was to start treatment for diverticulitis today if there was no improvement. Today, patient reports that his pain in significantly better. Denies fever, n/v, abdominal pain, hematuria, constipation, loose stools. He has made an effort to drink 2L of water, started Miralax, Colace. Also using Flomax. He has not used any pain medication.  Children - Patient reports that he has not had any children. He had sex with a girlfriend of 10 years, no protection and never got pregnant. He is currently with another girlfriend, has been sexually active with her for 3 years and is also not getting her pregnant. He would like a referral to check his fertility.   Martin [name] has a current medication list which includes the following prescription(s): amlodipine-benazepril, docusate sodium, ibuprofen, naproxen, oxycodone-acetaminophen, polyethylene glycol powder, tamsulosin, and valacyclovir. Also has No Known Allergies.  Martin [name]  has a past medical history of Hypertension; HSV (herpes simplex virus) infection; and Hyperlipidemia. Also  has past surgical history that includes Ankle surgery (02/14/2008); Appendectomy; and Ear Cyst Excision (N/A, 04/17/2013).  Objective:   Vitals: BP 130/100 mmHg  Pulse 79  Temp(Src) 98.6 F (37 C) (Oral)  Resp 16  Wt 245 lb 12.8 oz (111.494 kg)  SpO2 97%  BP Readings from Last 3 Encounters:  02/24/16 130/100  02/21/16 132/90  03/29/15 152/104   Physical Exam  Constitutional: He is oriented to person, place, and time. He appears well-developed and well-nourished.  HENT:  Mouth/Throat: Oropharynx is clear and moist.  Eyes: No scleral icterus.  Cardiovascular: Normal rate, regular rhythm and intact distal pulses.   Exam reveals no gallop and no friction rub.   No murmur heard. Pulmonary/Chest: No respiratory distress. He has no wheezes. He has no rales.  Abdominal: Soft. Bowel sounds are normal. He exhibits no distension and no mass.  Neurological: He is alert and oriented to person, place, and time.  Skin: Skin is warm and dry.   Dg Abd 1 View  02/24/2016  CLINICAL DATA:  Generalized abdominal pain EXAM: ABDOMEN - 1 VIEW COMPARISON:  02/21/2016 FINDINGS: Supine abdomen shows no gaseous bowel dilatation to suggest obstruction. No unexpected abdominal pelvic calcification. Multiple phleboliths overlie the inferior anatomic pelvis. Visualized bony anatomy is unremarkable. IMPRESSION: Stable.  No acute findings. Electronically Signed   By: Misty Stanley M.D.   On: 02/24/2016 16:17    Results for orders placed or performed in visit on 02/24/16 (from the past 24 hour(s))  POCT CBC     Status: Abnormal   Collection Time: 02/24/16  4:29 PM  Result Value Ref Range   WBC 6.4 4.6 - 10.2 K/uL   Lymph, poc 3.1 0.6 - 3.4   POC LYMPH PERCENT 48.7 10 - 50 %L   MID (cbc) 0.6 0 - 0.9   POC MID % 9.6 0 - 12 %M   POC Granulocyte 2.7 2 - 6.9   Granulocyte percent 41.7 37 - 80 %G   RBC 4.65 (A) 4.69 - 6.13 M/uL   Hemoglobin 14.2 14.1 - 18.1 g/dL   HCT, POC 40.3 (A) 43.5 - 53.7 %   MCV 86.6 80 - 97 fL   MCH, POC 30.5 27 - 31.2 pg   MCHC 35.3 31.8 - 35.4  g/dL   RDW, POC 13.9 %   Platelet Count, POC 293 142 - 424 K/uL   MPV 6.2 0 - 99.8 fL  POCT urinalysis dipstick     Status: Abnormal   Collection Time: 02/24/16  4:30 PM  Result Value Ref Range   Color, UA yellow yellow   Clarity, UA clear clear   Glucose, UA negative negative   Bilirubin, UA negative negative   Ketones, POC UA negative negative   Spec Grav, UA 1.020    Blood, UA trace-lysed (A) negative   pH, UA 5.5    Protein Ur, POC negative negative   Urobilinogen, UA 0.2    Nitrite, UA Negative Negative   Leukocytes, UA Negative Negative  POCT  Microscopic Urinalysis ([facility])     Status: None   Collection Time: 02/24/16  4:30 PM  Result Value Ref Range   WBC,UR,HPF,POC None None WBC/hpf   RBC,UR,HPF,POC None None RBC/hpf   Bacteria None None, Too numerous to count   Mucus Absent Absent   Epithelial Cells, UR Per Microscopy None None, Too numerous to count cells/hpf   Assessment and Plan :   1. Generalized abdominal pain 2. Hematuria 3. Constipation, unspecified constipation type 4. History of diverticulitis - UA is improved, he is committed to continuing lifestyle modifications as his belly pain has drastically improved. Continue to monitor symptoms, he is to return to clinic if his belly pain returns, consider treatment for diverticulitis as appropriate.   5. Essential hypertension - Patient did not take his medication this morning.   6. Infertility counseling - Ambulatory referral to Endocrinology.   Jaynee Eagles, PA-C [facility] [phone] 02/24/2016 3:45 PM

## 2016-02-24 NOTE — Patient Instructions (Addendum)
Abdominal Pain, Adult Many things can cause abdominal pain. Usually, abdominal pain is not caused by a disease and will improve without treatment. It can often be observed and treated at home. Your health care provider will do a physical exam and possibly order blood tests and X-rays to help determine the seriousness of your pain. However, in many cases, more time must pass before a clear cause of the pain can be found. Before that point, your health care provider may not know if you need more testing or further treatment. HOME CARE INSTRUCTIONS Monitor your abdominal pain for any changes. The following actions may help to alleviate any discomfort you are experiencing:  Only take over-the-counter or prescription medicines as directed by your health care provider.  Do not take laxatives unless directed to do so by your health care provider.  Try a clear liquid diet (broth, tea, or water) as directed by your health care provider. Slowly move to a bland diet as tolerated. SEEK MEDICAL CARE IF:  You have unexplained abdominal pain.  You have abdominal pain associated with nausea or diarrhea.  You have pain when you urinate or have a bowel movement.  You experience abdominal pain that wakes you in the night.  You have abdominal pain that is worsened or improved by eating food.  You have abdominal pain that is worsened with eating fatty foods.  You have a fever. SEEK IMMEDIATE MEDICAL CARE IF:  Your pain does not go away within 2 hours.  You keep throwing up (vomiting).  Your pain is felt only in portions of the abdomen, such as the right side or the left lower portion of the abdomen.  You pass bloody or black tarry stools. MAKE SURE YOU:  Understand these instructions.  Will watch your condition.  Will get help right away if you are not doing well or get worse.   This information is not intended to replace advice given to you by your health care provider. Make sure you discuss  any questions you have with your health care provider.   Document Released: 06/03/2005 Document Revised: 05/15/2015 Document Reviewed: 05/03/2013 Elsevier Interactive Patient Education 2016 [employer].   Renal Colic Renal colic is pain that is caused by passing a kidney stone. The pain can be sharp and severe. It may be felt in the back, abdomen, side (flank), or groin. It can cause nausea. Renal colic can come and go. HOME CARE INSTRUCTIONS Watch your condition for any changes. The following actions may help to lessen any discomfort that you are feeling:  Take medicines only as directed by your health care provider.  Ask your health care provider if it is okay to take over-the-counter pain medicine.  Drink enough fluid to keep your urine clear or pale yellow. Drink 6-8 glasses of water each day.  Limit the amount of salt that you eat to less than 2 grams per day.  Reduce the amount of protein in your diet. Eat less meat, fish, nuts, and dairy.  Avoid foods such as spinach, rhubarb, nuts, or bran. These may make kidney stones more likely to form. SEEK MEDICAL CARE IF:  You have a fever or chills.  Your urine smells bad or looks cloudy.  You have pain or burning when you pass urine. SEEK IMMEDIATE MEDICAL CARE IF:  Your flank pain or groin pain suddenly worsens.  You become confused or disoriented or you lose consciousness.   This information is not intended to replace advice given to you  by your health care provider. Make sure you discuss any questions you have with your health care provider.   Document Released: 06/03/2005 Document Revised: 09/14/2014 Document Reviewed: 07/04/2014 Elsevier Interactive Patient Education 2016 [employer].   IF you received an x-ray today, you will receive an invoice from [facility]. Please contact [facility] at [phone] with questions or concerns regarding your invoice.   IF you received labwork today, you will  receive an invoice from [employer]. Please contact [facility] at [phone] with questions or concerns regarding your invoice.   Our billing staff will not be able to assist you with questions regarding bills from these companies.  You will be contacted with the lab results as soon as they are available. The fastest way to get your results is to activate your My Chart account. Instructions are located on the last page of this paperwork. If you have not heard from [place] regarding the results in 2 weeks, please contact this office.

## 2016-12-08 ENCOUNTER — Other Ambulatory Visit: Payer: Self-pay | Admitting: Family Medicine

## 2016-12-08 ENCOUNTER — Ambulatory Visit
Admission: RE | Admit: 2016-12-08 | Discharge: 2016-12-08 | Disposition: A | Discharge status: Home / Self Care | Payer: BC Managed Care – PPO | Source: Ambulatory Visit | Attending: Family Medicine | Admitting: Family Medicine

## 2016-12-08 DIAGNOSIS — Z0000 Encounter for general adult medical examination without abnormal findings: Secondary | ICD-10-CM

## 2016-12-10 ENCOUNTER — Ambulatory Visit (INDEPENDENT_AMBULATORY_CARE_PROVIDER_SITE_OTHER): Payer: BC Managed Care – PPO | Admitting: Physician Assistant

## 2016-12-10 VITALS — BP 154/107 | HR 78 | Temp 99.0°F | Resp 18 | Ht 72.0 in | Wt 251.0 lb

## 2016-12-10 DIAGNOSIS — R300 Dysuria: Secondary | ICD-10-CM | POA: Diagnosis not present

## 2016-12-10 DIAGNOSIS — Z113 Encounter for screening for infections with a predominantly sexual mode of transmission: Secondary | ICD-10-CM | POA: Diagnosis not present

## 2016-12-10 LAB — POCT URINALYSIS DIP (MANUAL ENTRY)
Bilirubin, UA: NEGATIVE
Glucose, UA: NEGATIVE
Ketones, POC UA: NEGATIVE
NITRITE UA: NEGATIVE
PH UA: 6.5 (ref 5.0–8.0)
SPEC GRAV UA: 1.02 (ref 1.030–1.035)
UROBILINOGEN UA: 0.2 (ref ?–2.0)

## 2016-12-10 LAB — POC MICROSCOPIC URINALYSIS (UMFC): MUCUS RE: ABSENT

## 2016-12-10 MED ORDER — SULFAMETHOXAZOLE-TRIMETHOPRIM 800-160 MG PO TABS: 1.0000 | ORAL_TABLET | Freq: Two times a day (BID) | ORAL | 0 refills | Status: AC

## 2016-12-10 NOTE — Progress Notes (Signed)
PRIMARY CARE AT [place], [place] [id number] [phone]  Date:  12/10/2016   Name:  Martin [name]   DOB:  Nov 07, 1970   MRN:  [id number]  PCP:  Maggie Font, MD    History of Present Illness:  Martin [name] is a 46 y.o. male patient who presents to PCP with  Chief Complaint  Patient presents with  . Urinary Urgency    Urine odor,   . Abdominal Pain    Pt describes as discomfort, left lower quadrant  . Cough    More productive QAM  . Std Test     Patient reports about 1 week of abdominal pain, but has new dysuria, and frequency.  Urine has foul odor.  No nausea.  Lower abdomen with pain.  Noticed mild back pain.  No penile discharge. Sexually active with one partner.  Requesting std check.  No rash.   Patient Active Problem List   Diagnosis Date Noted  . Tobacco abuse 08/13/2014  . Scalp mass 05/31/2012  . 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 HYPERTENSION 12/20/2008  . LUNG NODULE 12/20/2008    Past Medical History:  Diagnosis Date  . HSV (herpes simplex virus) infection   . Hyperlipidemia   . Hypertension     Past Surgical History:  Procedure Laterality Date  . ANKLE SURGERY  02/14/2008   Right Leg/Right Ankle  . APPENDECTOMY    . EAR CYST EXCISION N/A 04/17/2013   Procedure: CYST REMOVAL ON SCALP X2, BACK;  Surgeon: Harl Bowie, MD;  Location: [facility];  Service: General;  Laterality: N/A;  head and back    Social History  Substance Use Topics  . Smoking status: Current Every Day Smoker    Packs/day: 0.50    Years: 12.00    Types: Cigarettes  . Smokeless tobacco: Never Used  . Alcohol use Yes     Comment: Twice/week.    No family history on file.  No Known Allergies  Medication list has been reviewed and updated.  Current Outpatient Prescriptions on File Prior to Visit  Medication Sig Dispense Refill  . amLODipine-benazepril (LOTREL) 5-20 MG capsule Take 1 capsule by mouth daily.    . valACYclovir (VALTREX)  1000 MG tablet Take 1 tablet (1,000 mg total) by mouth daily. 90 tablet 3  . oxyCODONE-acetaminophen (ROXICET) 5-325 MG tablet Take 1 tablet by mouth every 8 (eight) hours as needed for severe pain. (Patient not taking: Reported on 02/24/2016) 20 tablet 0   No current facility-administered medications on file prior to visit.     ROS ROS otherwise unremarkable unless listed above.  Physical Examination: BP (!) 154/107   Pulse 78   Temp 99 F (37.2 C) (Oral)   Resp 18   Ht 6' (1.829 m)   Wt 251 lb (113.9 kg)   SpO2 98%   BMI 34.04 kg/m  Ideal Body Weight: Weight in (lb) to have BMI = 25: 183.9  Physical Exam  Constitutional: He is oriented to person, place, and time. He appears well-developed and well-nourished. No distress.  HENT:  Head: Normocephalic and atraumatic.  Eyes: Conjunctivae and EOM are normal. Pupils are equal, round, and reactive to light.  Cardiovascular: Normal rate.   Pulmonary/Chest: Effort normal. No respiratory distress.  Abdominal: Soft. Normal appearance and bowel sounds are normal. There is tenderness (mild left flank tenderness) in the suprapubic area.  Neurological: He is alert and oriented to person, place, and time.  Skin:  Skin is warm and dry. He is not diaphoretic.  Psychiatric: He has a normal mood and affect. His behavior is normal.    Results for orders placed or performed in visit on 12/10/16  POCT Microscopic Urinalysis ([facility])  Result Value Ref Range   WBC,UR,HPF,POC Too numerous to count  (A) None WBC/hpf   RBC,UR,HPF,POC Moderate (A) None RBC/hpf   Bacteria Many (A) None, Too numerous to count   Mucus Absent Absent   Epithelial Cells, UR Per Microscopy Moderate (A) None, Too numerous to count cells/hpf  POCT urinalysis dipstick  Result Value Ref Range   Color, UA yellow yellow   Clarity, UA cloudy (A) clear   Glucose, UA negative negative   Bilirubin, UA negative negative   Ketones, POC UA negative negative   Spec Grav, UA 1.020  1.030 - 1.035   Blood, UA moderate (A) negative   pH, UA 6.5 5.0 - 8.0   Protein Ur, POC =30 (A) negative   Urobilinogen, UA 0.2 Negative - 2.0   Nitrite, UA Negative Negative   Leukocytes, UA small (1+) (A) Negative    Assessment and Plan: Martin [name] is a 46 y.o. male who is here today for cc of dysuria. Will treat for possible pyelonephritis.   Possible std.  Gonorrhea chlamydia obtained and urine culture.   Dysuria - Plan: POCT Microscopic Urinalysis ([facility]), POCT urinalysis dipstick, Urine culture, GC/Chlamydia Probe Amp, HIV antibody, RPR, sulfamethoxazole-trimethoprim (BACTRIM DS,SEPTRA DS) 800-160 MG tablet  Screening for STD (sexually transmitted disease) - Plan: Urine culture, GC/Chlamydia Probe Amp, HIV antibody, RPR  Ivar Drape, PA-C [facility] 4/9/201810:52 AM

## 2016-12-10 NOTE — Patient Instructions (Addendum)
   Pyelonephritis, Adult Pyelonephritis is a kidney infection. The kidneys are organs that help clean your blood by moving waste out of your blood and into your pee (urine). This infection can happen quickly, or it can last for a long time. In most cases, it clears up with treatment and does not cause other problems. Follow these instructions at home: Medicines   Take over-the-counter and prescription medicines only as told by your doctor.  Take your antibiotic medicine as told by your doctor. Do not stop taking the medicine even if you start to feel better. General instructions   Drink enough fluid to keep your pee clear or pale yellow.  Avoid caffeine, tea, and carbonated drinks.  Pee (urinate) often. Avoid holding in pee for long periods of time.  Pee before and after sex.  After pooping (having a bowel movement), women should wipe from front to back. Use each tissue only once.  Keep all follow-up visits as told by your doctor. This is important. Contact a doctor if:  You do not feel better after 2 days.  Your symptoms get worse.  You have a fever. Get help right away if:  You cannot take your medicine or drink fluids as told.  You have chills and shaking.  You throw up (vomit).  You have very bad pain in your side (flank) or back.  You feel very weak or you pass out (faint). This information is not intended to replace advice given to you by your health care provider. Make sure you discuss any questions you have with your health care provider. Document Released: 10/01/2004 Document Revised: 01/30/2016 Document Reviewed: 12/17/2014 Elsevier Interactive Patient Education  2017 [employer]     IF you received an x-ray today, you will receive an invoice from [facility]. Please contact [facility] at [phone] with questions or concerns regarding your invoice.   IF you received labwork today, you will receive an invoice from [facility]. Please  contact [facility] at [phone] with questions or concerns regarding your invoice.   Our billing staff will not be able to assist you with questions regarding bills from these companies.  You will be contacted with the lab results as soon as they are available. The fastest way to get your results is to activate your My Chart account. Instructions are located on the last page of this paperwork. If you have not heard from us regarding the results in 2 weeks, please contact this office.     

## 2016-12-11 LAB — RPR: RPR Ser Ql: NONREACTIVE

## 2016-12-11 LAB — HIV ANTIBODY (ROUTINE TESTING W REFLEX): HIV Screen 4th Generation wRfx: NONREACTIVE

## 2016-12-12 LAB — URINE CULTURE

## 2016-12-12 LAB — GC/CHLAMYDIA PROBE AMP
CHLAMYDIA, DNA PROBE: NEGATIVE
Neisseria gonorrhoeae by PCR: NEGATIVE

## 2016-12-26 ENCOUNTER — Telehealth: Payer: Self-pay

## 2016-12-26 NOTE — Telephone Encounter (Signed)
Pt called to make sure antibiotic given would treat uti bacteria grown as well Bactrim given and shows sensitive, pt advised

## 2017-10-13 ENCOUNTER — Other Ambulatory Visit: Payer: Self-pay | Admitting: Student

## 2017-10-17 ENCOUNTER — Other Ambulatory Visit: Payer: Self-pay | Admitting: Student

## 2017-10-17 DIAGNOSIS — F17200 Nicotine dependence, unspecified, uncomplicated: Secondary | ICD-10-CM

## 2017-10-17 DIAGNOSIS — R05 Cough: Secondary | ICD-10-CM

## 2017-10-17 DIAGNOSIS — R9389 Abnormal findings on diagnostic imaging of other specified body structures: Secondary | ICD-10-CM

## 2017-10-17 DIAGNOSIS — R059 Cough, unspecified: Secondary | ICD-10-CM

## 2017-10-25 ENCOUNTER — Ambulatory Visit
Admission: RE | Admit: 2017-10-25 | Discharge: 2017-10-25 | Disposition: A | Discharge status: Home / Self Care | Payer: BC Managed Care – PPO | Source: Ambulatory Visit | Attending: Student | Admitting: Student

## 2017-10-25 DIAGNOSIS — R9389 Abnormal findings on diagnostic imaging of other specified body structures: Secondary | ICD-10-CM

## 2017-10-25 DIAGNOSIS — R05 Cough: Secondary | ICD-10-CM

## 2017-10-25 DIAGNOSIS — R059 Cough, unspecified: Secondary | ICD-10-CM

## 2017-10-25 DIAGNOSIS — F17200 Nicotine dependence, unspecified, uncomplicated: Secondary | ICD-10-CM

## 2017-12-08 ENCOUNTER — Encounter: Payer: Self-pay | Admitting: Physician Assistant

## 2018-08-23 ENCOUNTER — Other Ambulatory Visit: Payer: Self-pay | Admitting: Family Medicine

## 2018-08-23 ENCOUNTER — Other Ambulatory Visit (HOSPITAL_COMMUNITY)
Admission: RE | Admit: 2018-08-23 | Discharge: 2018-08-23 | Disposition: A | Discharge status: Home / Self Care | Payer: BC Managed Care – PPO | Source: Ambulatory Visit | Attending: Family Medicine | Admitting: Family Medicine

## 2018-08-23 DIAGNOSIS — Z113 Encounter for screening for infections with a predominantly sexual mode of transmission: Secondary | ICD-10-CM | POA: Insufficient documentation

## 2018-08-26 LAB — URINE CYTOLOGY ANCILLARY ONLY
BACTERIAL VAGINITIS: POSITIVE — AB
Candida vaginitis: NEGATIVE
Chlamydia: NEGATIVE
Neisseria Gonorrhea: NEGATIVE
TRICH (WINDOWPATH): NEGATIVE

## 2019-04-04 ENCOUNTER — Ambulatory Visit
Admission: RE | Admit: 2019-04-04 | Discharge: 2019-04-04 | Disposition: A | Discharge status: Home / Self Care | Payer: BC Managed Care – PPO | Source: Ambulatory Visit | Attending: Nurse Practitioner | Admitting: Nurse Practitioner

## 2019-04-04 ENCOUNTER — Other Ambulatory Visit: Payer: Self-pay | Admitting: Nurse Practitioner

## 2019-04-04 DIAGNOSIS — R1032 Left lower quadrant pain: Secondary | ICD-10-CM

## 2019-04-18 ENCOUNTER — Ambulatory Visit
Admission: RE | Admit: 2019-04-18 | Discharge: 2019-04-18 | Disposition: A | Discharge status: Home / Self Care | Payer: BC Managed Care – PPO | Source: Ambulatory Visit | Attending: Nurse Practitioner | Admitting: Nurse Practitioner

## 2019-04-18 ENCOUNTER — Other Ambulatory Visit: Payer: Self-pay | Admitting: Nurse Practitioner

## 2019-04-18 ENCOUNTER — Other Ambulatory Visit: Payer: Self-pay

## 2019-04-18 DIAGNOSIS — M25571 Pain in right ankle and joints of right foot: Secondary | ICD-10-CM

## 2019-04-18 DIAGNOSIS — M79671 Pain in right foot: Secondary | ICD-10-CM

## 2019-04-18 DIAGNOSIS — M25572 Pain in left ankle and joints of left foot: Secondary | ICD-10-CM

## 2019-04-18 DIAGNOSIS — M79672 Pain in left foot: Secondary | ICD-10-CM

## 2019-06-08 ENCOUNTER — Ambulatory Visit: Payer: BC Managed Care – PPO

## 2019-06-08 ENCOUNTER — Encounter: Payer: Self-pay | Admitting: Podiatry

## 2019-06-08 ENCOUNTER — Other Ambulatory Visit: Payer: Self-pay

## 2019-06-08 ENCOUNTER — Ambulatory Visit: Payer: BC Managed Care – PPO | Admitting: Podiatry

## 2019-06-08 VITALS — BP 159/106 | HR 77 | Resp 16

## 2019-06-08 DIAGNOSIS — M722 Plantar fascial fibromatosis: Secondary | ICD-10-CM

## 2019-06-08 DIAGNOSIS — S86012A Strain of left Achilles tendon, initial encounter: Secondary | ICD-10-CM

## 2019-06-08 DIAGNOSIS — M778 Other enthesopathies, not elsewhere classified: Secondary | ICD-10-CM | POA: Diagnosis not present

## 2019-06-09 ENCOUNTER — Telehealth: Payer: Self-pay | Admitting: Podiatry

## 2019-06-09 NOTE — Telephone Encounter (Signed)
Patient was suppose to receive a steriod

## 2019-06-10 ENCOUNTER — Encounter: Payer: Self-pay | Admitting: Podiatry

## 2019-06-10 NOTE — Progress Notes (Signed)
Subjective:  Patient ID: Martin [name], male    DOB: [birth year],  MRN: [id number] HPI Chief Complaint  Patient presents with  . Foot Pain    Posterior heel/achilles left - aching, burning, pulling x several months, no injury, "may be a knot" PCP rx'd meds (unsure of name)  . Foot Pain    1st MPJ right - aching, some redness x years, shoes rub, especially boots, throbbing occas  . New Patient (Initial Visit)    48 y.o. male presents with the above complaint.   ROS: Denies fever chills nausea vomiting muscle aches pains calf pain back pain chest pain shortness of breath.  Past Medical History:  Diagnosis Date  . HSV (herpes simplex virus) infection   . Hyperlipidemia   . Hypertension    Past Surgical History:  Procedure Laterality Date  . ANKLE SURGERY  02/14/2008   Right Leg/Right Ankle  . APPENDECTOMY    . EAR CYST EXCISION N/A 04/17/2013   Procedure: CYST REMOVAL ON SCALP X2, BACK;  Surgeon: Harl Bowie, MD;  Location: [facility];  Service: General;  Laterality: N/A;  head and back    Current Outpatient Medications:  .  amLODipine (NORVASC) 10 MG tablet, Take 10 mg by mouth at bedtime., Disp: , Rfl:  .  hydrochlorothiazide (MICROZIDE) 12.5 MG capsule, TAKE 1 CAPSULE BY MOUTH ONCE DAILY IN THE MORNING, Disp: , Rfl:  .  lisinopril (ZESTRIL) 20 MG tablet, Take 20 mg by mouth daily., Disp: , Rfl:  .  NOREL AD 4-10-325 MG TABS, TAKE 1 TABLET BY MOUTH 4 TIMES DAILY, Disp: , Rfl:  .  valACYclovir (VALTREX) 1000 MG tablet, Take 1 tablet (1,000 mg total) by mouth daily., Disp: 90 tablet, Rfl: 3 .  valsartan (DIOVAN) 80 MG tablet, Take 80 mg by mouth daily., Disp: , Rfl:   No Known Allergies Review of Systems Objective:   Vitals:   06/08/19 1001  BP: (!) 159/106  Pulse: 77  Resp: 16    General: Well developed, nourished, in no acute distress, alert and oriented x3 vital signs are elevated recommended that he follow-up with primary care for his hypertension.   Dermatological: Skin is warm, dry and supple bilateral. Nails x 10 are well maintained; remaining integument appears unremarkable at this time. There are no open sores, no preulcerative lesions, no rash or signs of infection present.  Vascular: Dorsalis Pedis artery and Posterior Tibial artery pedal pulses are 2/4 bilateral with immedate capillary fill time. Pedal hair growth present. No varicosities and no lower extremity edema present bilateral.   Neruologic: Grossly intact via light touch bilateral. Vibratory intact via tuning fork bilateral. Protective threshold with Semmes Wienstein monofilament intact to all pedal sites bilateral. Patellar and Achilles deep tendon reflexes 2+ bilateral. No Babinski or clonus noted bilateral.   Musculoskeletal: No gross boney pedal deformities bilateral. No pain, crepitus, or limitation noted with foot and ankle range of motion bilateral. Muscular strength 5/5 in all groups tested bilateral.  There is a small nonpulsatile nodule mid Achilles left.  Appears to be an old tear he denies any injury states is been present for about 7 months no ecchymosis margins are fine does not appear to be any diastases.  He also has some tenderness on palpation and range of motion of the first metatarsophalangeal joint of the right foot some redness overlying the dorsal medial aspect of the metatarsal phalangeal joint.   Gait: Unassisted, Nonantalgic.    Radiographs:  Radiographs reviewed today all taken  last month demonstrate what appears to be early osteoarthritic changes first metatarsophalangeal joint of the right foot.  Assessment & Plan:   Assessment: Achilles tendon nodule probable old tear becoming more symptomatic.  Osteoarthritis first metatarsophalangeal joint right.  Plan: We will put him in a short cam walker left for the Achilles.  Also injected the area subcutaneously today point of maximal tenderness.  2 mg of dexamethasone and local anesthetic also injected  the plantar fascia left and injected dexamethasone right.  We will get him started on methylprednisolone and followed by meloxicam.      T. [place], [place]

## 2019-06-12 ENCOUNTER — Telehealth: Payer: Self-pay | Admitting: *Deleted

## 2019-06-12 MED ORDER — MELOXICAM 15 MG PO TABS: 15.0000 mg | ORAL_TABLET | Freq: Every day | ORAL | 0 refills | Status: DC

## 2019-06-12 MED ORDER — METHYLPREDNISOLONE 4 MG PO TBPK: ORAL_TABLET | ORAL | 0 refills | Status: DC

## 2019-06-12 NOTE — Telephone Encounter (Signed)
I informed pt of Dr. Stephenie Acres orders and pt states understanding.

## 2019-06-12 NOTE — Addendum Note (Signed)
Addended by: Rip Harbour on: 06/12/2019 08:34 AM   Modules accepted: Orders

## 2019-06-12 NOTE — Telephone Encounter (Signed)
-----   Message from Garrel Ridgel, [place] sent at 06/10/2019 11:31 AM EDT ----- Val please call this patient and inform him that I would like for him to be on a Medrol Dosepak followed by meloxicam.  Please call these in for him send them to his pharmacy.  Thank you

## 2019-06-12 NOTE — Telephone Encounter (Signed)
I spoke with pt in another Telephone Call.

## 2019-07-06 ENCOUNTER — Ambulatory Visit: Payer: BC Managed Care – PPO | Admitting: Podiatry

## 2019-07-06 ENCOUNTER — Other Ambulatory Visit: Payer: Self-pay

## 2019-07-06 ENCOUNTER — Encounter: Payer: Self-pay | Admitting: Podiatry

## 2019-07-06 DIAGNOSIS — S86012D Strain of left Achilles tendon, subsequent encounter: Secondary | ICD-10-CM | POA: Diagnosis not present

## 2019-07-06 NOTE — Progress Notes (Signed)
He presents today for follow-up of his Achilles tendon tear and nodule left.  He states that is doing much better approximately 90% improved.  Purchased himself a posterior splint to wear with his boot while at work and he wears his cam walker at home.  Objective: Vital signs are stable he is alert and oriented x3 he has no pain on palpation of the tendo Achilles nodule left.  Assessment: Well-healing Achilles nodule partial tear left.  Plan: Continue to treat this with the posterior splint or the cam walker for the next month or so until this has completely resolved.  Follow-up with me as needed or in 1 month

## 2019-08-08 ENCOUNTER — Ambulatory Visit: Payer: BC Managed Care – PPO | Admitting: Podiatry

## 2020-03-25 IMAGING — CR LEFT FOOT - COMPLETE 3+ VIEW
3 series · 3 of 3 positions shown · non-contrast
Comparison: None.

CLINICAL DATA: Left posterior ankle pain

EXAM:
LEFT FOOT - COMPLETE 3+ VIEW

[x foot ap left]
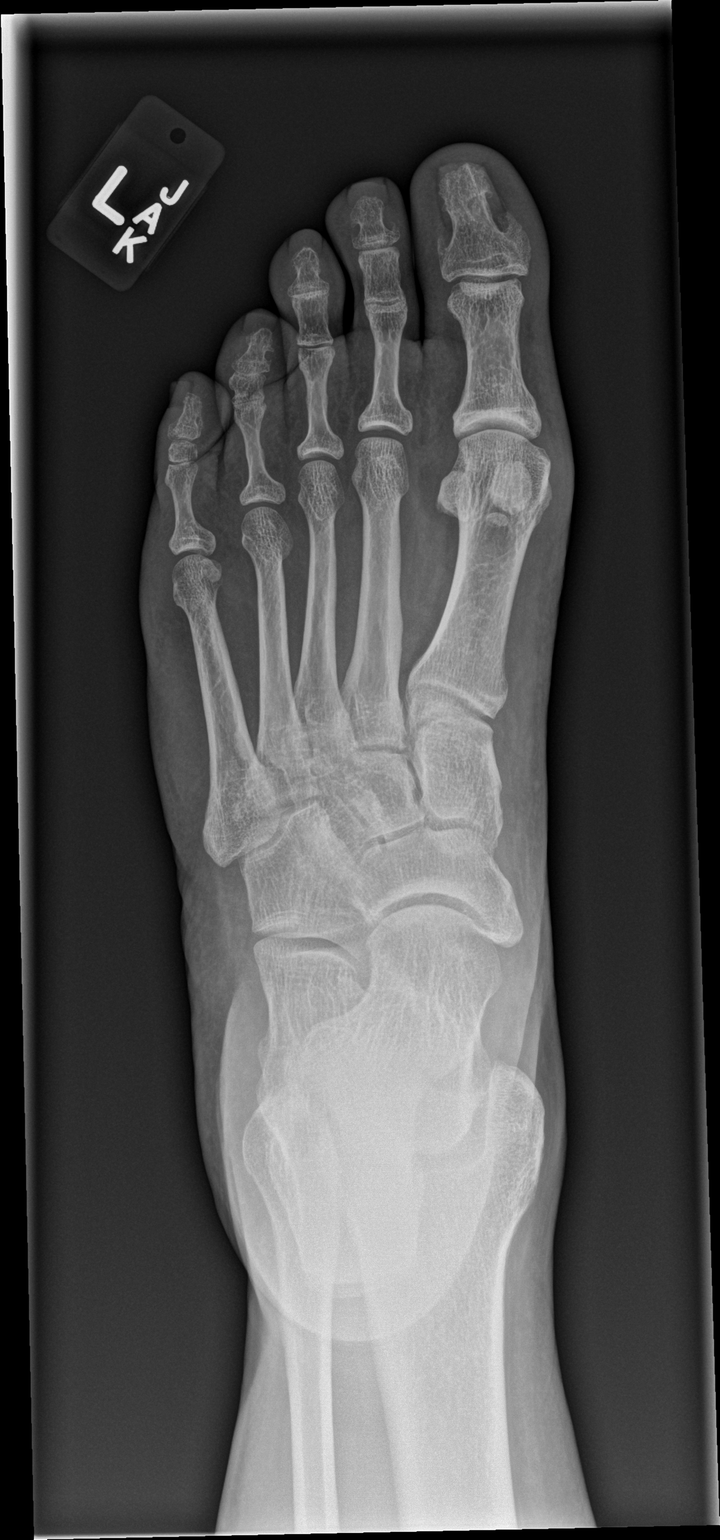

[x foot obl left]
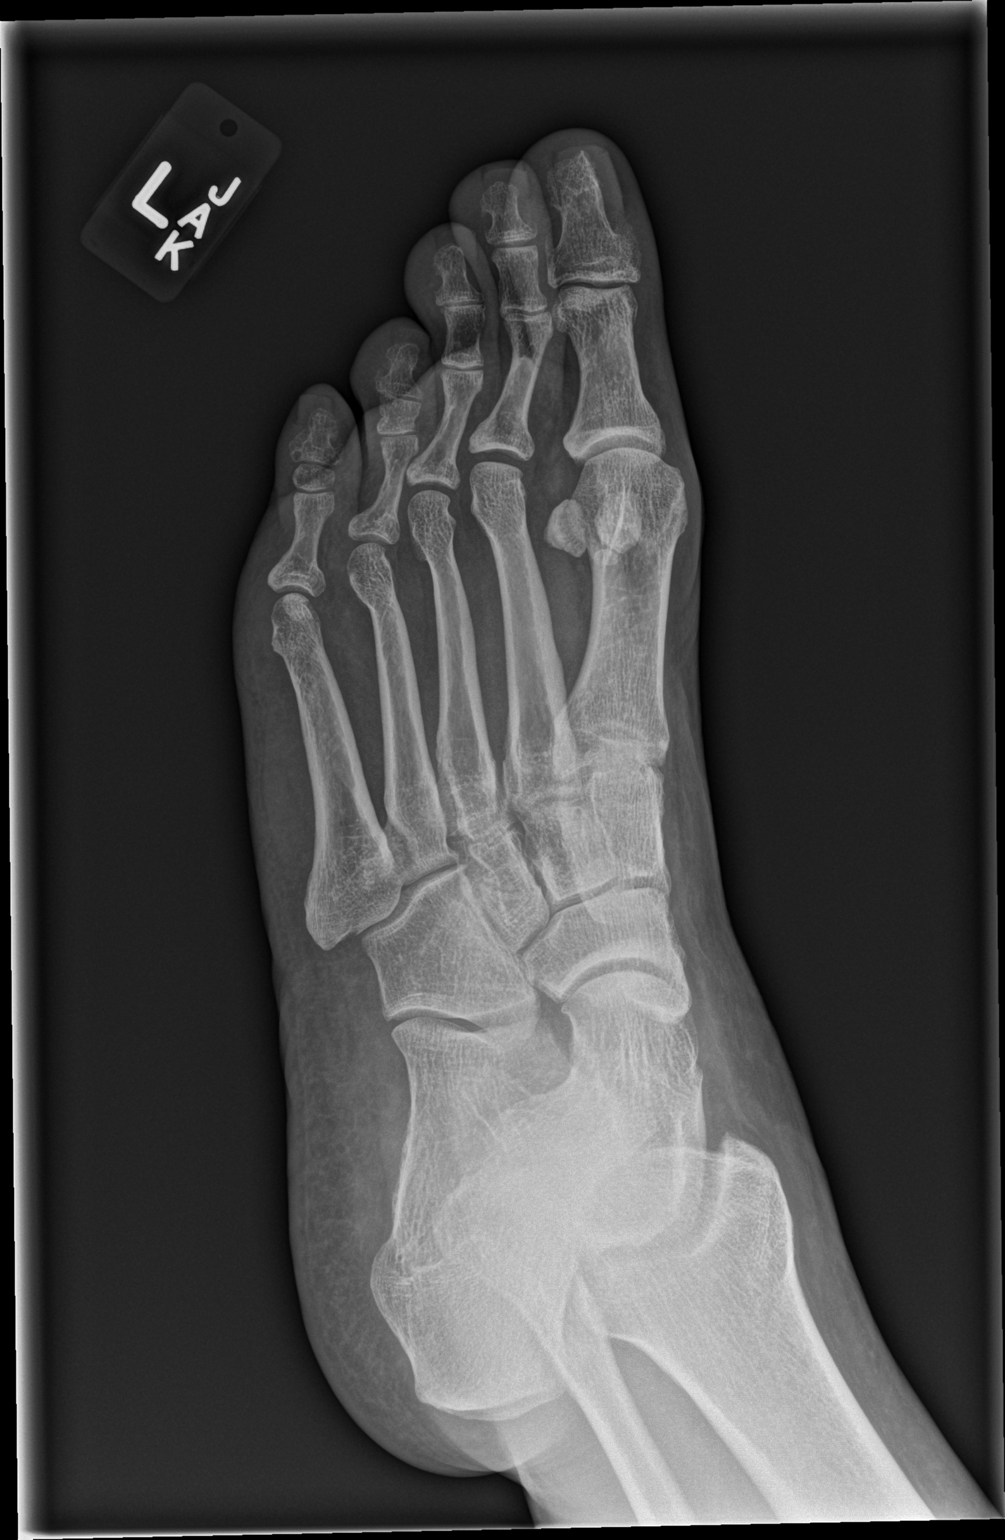

[x foot lat left]
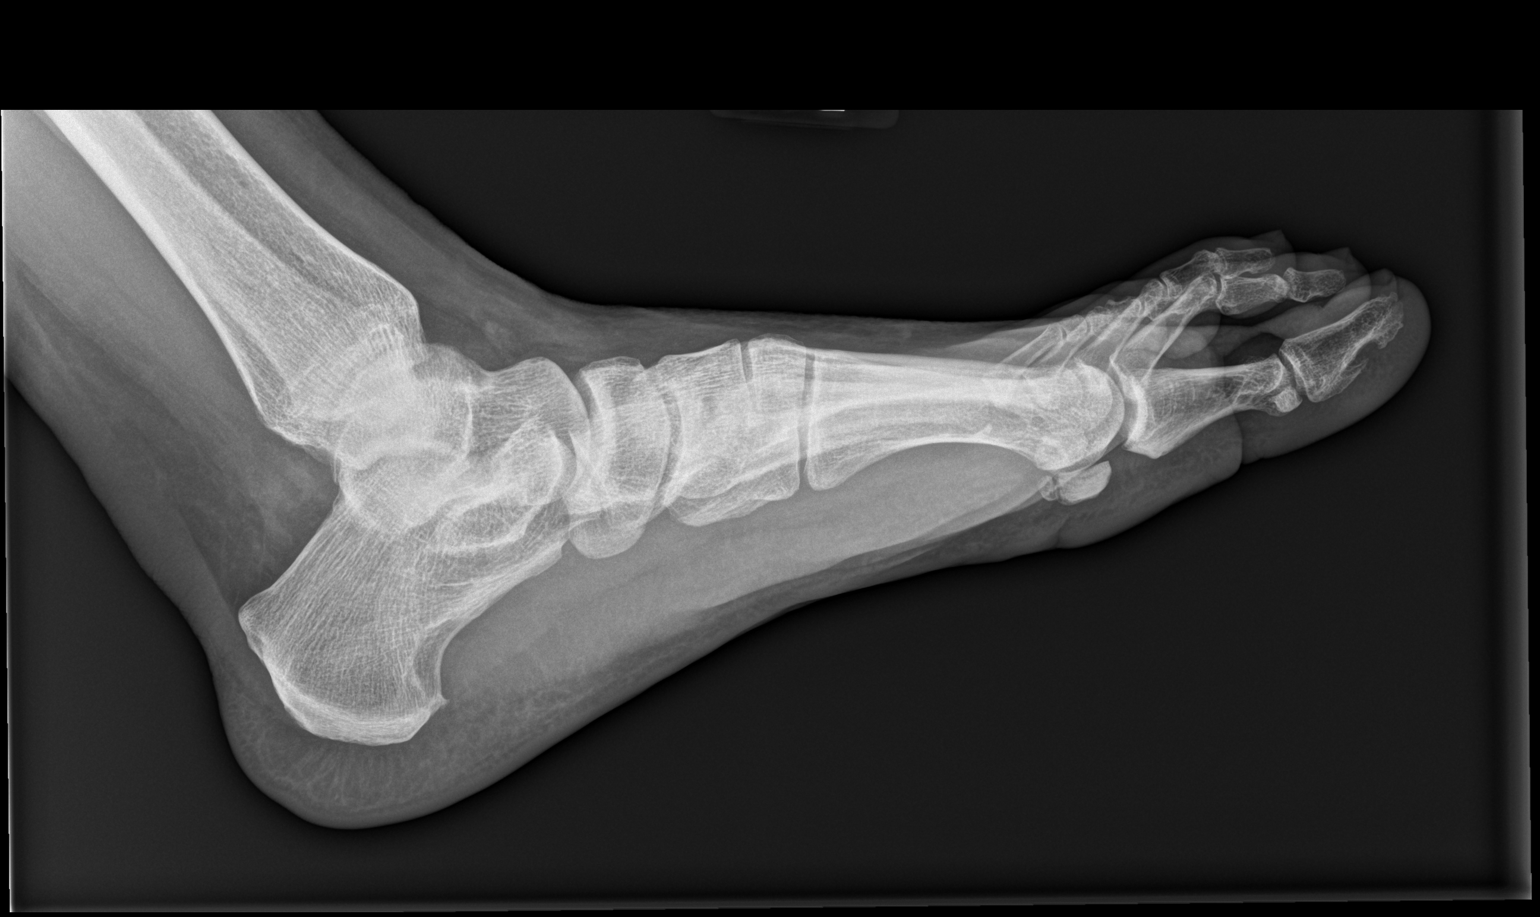

[3 of 3 positions shown; findings below may reference images not displayed]

FINDINGS: No acute fracture or dislocation identified. No radio-opaque foreign
body or soft tissue calcifications identified. Small plantar heel
spur identified.
IMPRESSION: 1. No acute findings.
2. Small plantar heel spur.

## 2020-03-25 IMAGING — CR RIGHT FOOT COMPLETE - 3+ VIEW
3 series · 3 of 3 positions shown · non-contrast
Comparison: None.

CLINICAL DATA: Right first MTP joint pain.

EXAM:
RIGHT FOOT COMPLETE - 3+ VIEW

[x foot ap right]
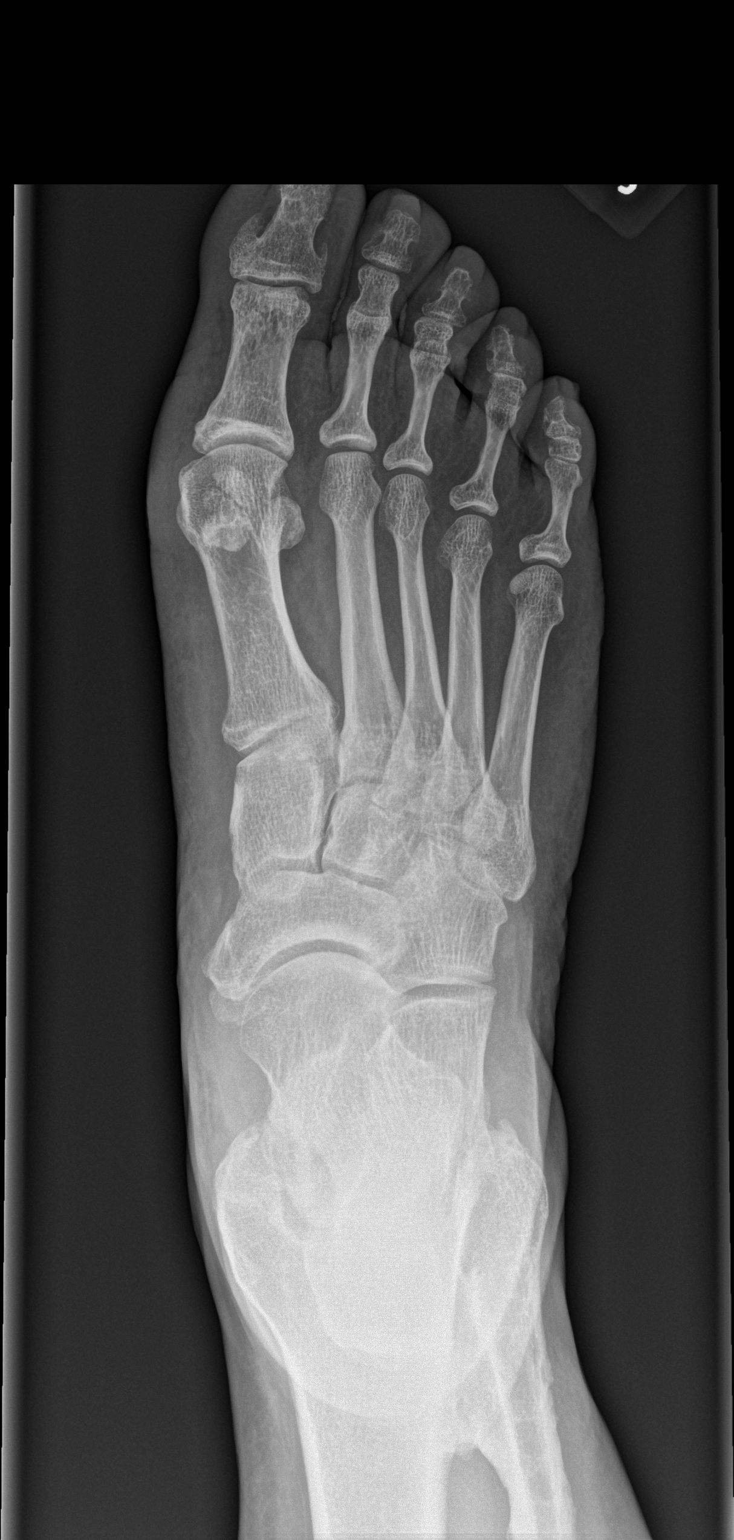

[x foot obl right]
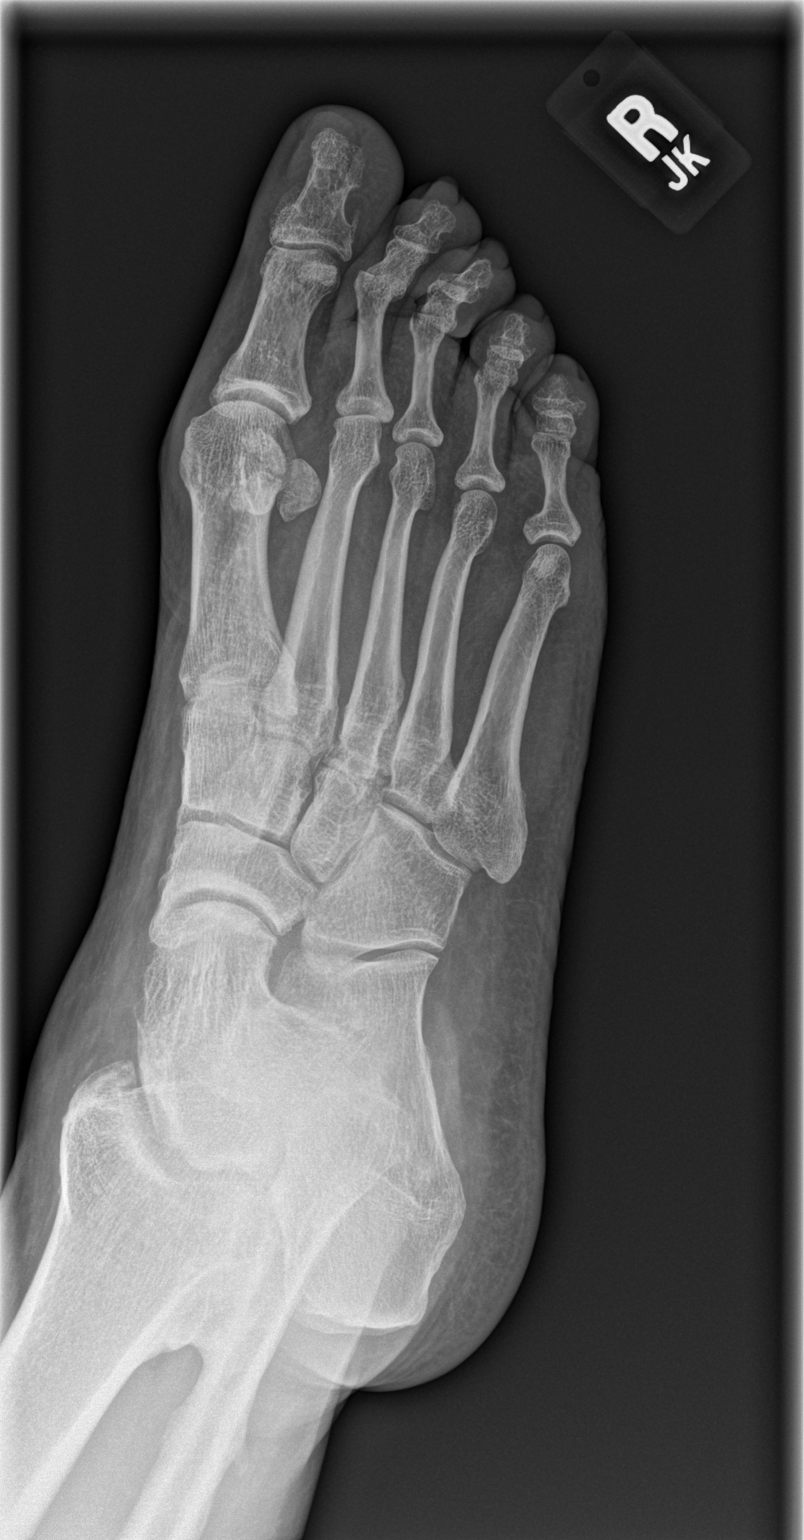

[x foot lat right]
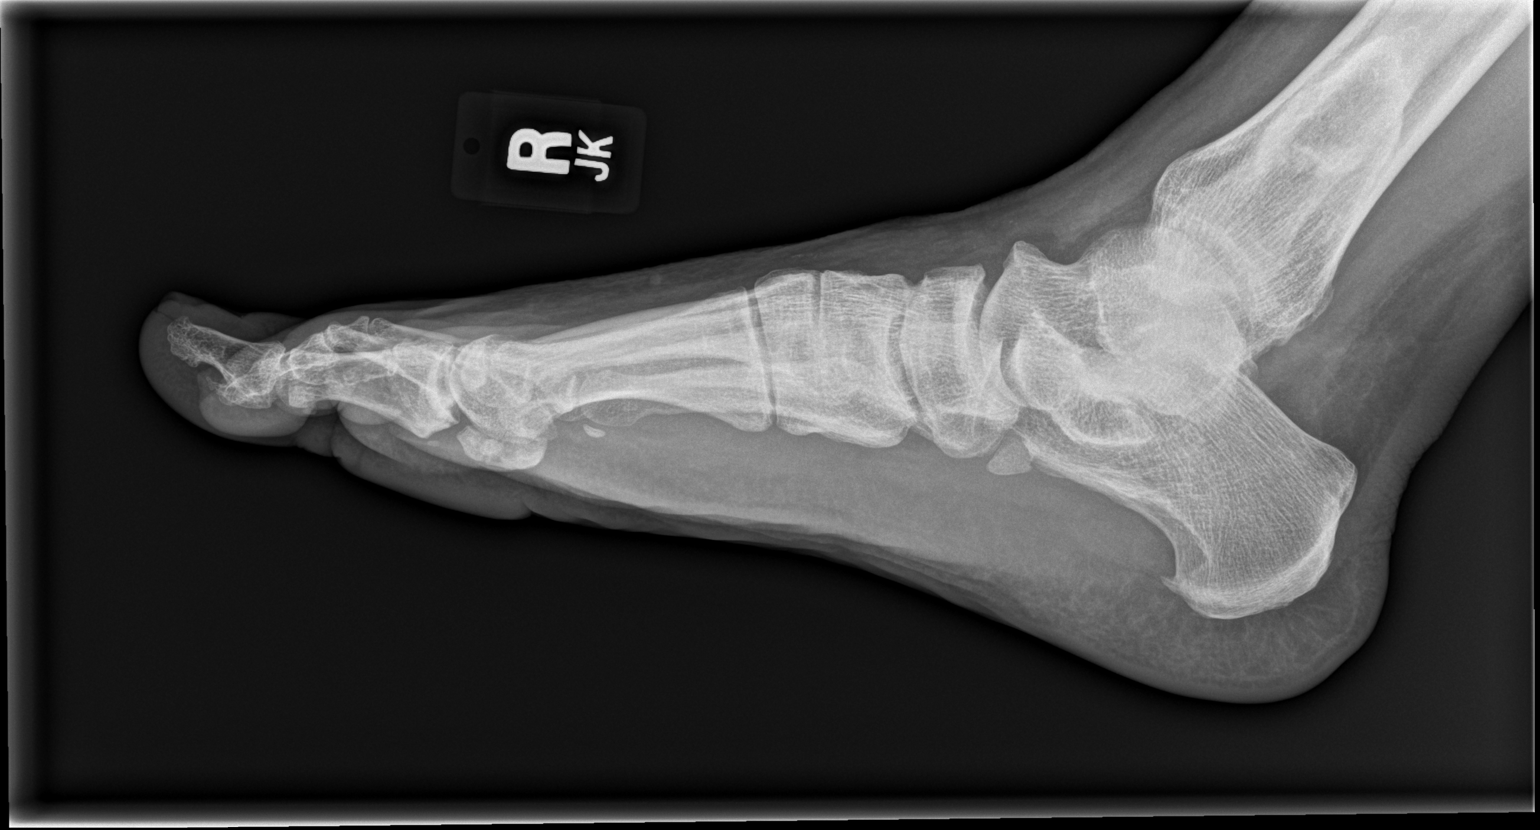

[3 of 3 positions shown; findings below may reference images not displayed]

FINDINGS: Mild degenerative changes at the first MTP joint with mild hallux
valgus deformity. There are moderate degenerative changes at the IP
joint of the great toe with spurring changes. The other MTP joints
are maintained. The midfoot bony structures are intact. Type 2 os
naviculare noted. There is a small calcaneal heel spur noted.
Evidence of prior healed ankle fractures.
IMPRESSION: Mild degenerative changes at the first MTP joint and moderate
degenerative changes at the first IP joint.

No acute bony findings.

## 2020-03-29 ENCOUNTER — Emergency Department (HOSPITAL_COMMUNITY)
Admission: EM | Admit: 2020-03-29 | Discharge: 2020-03-30 | Disposition: A | Discharge status: Home / Self Care | Payer: BC Managed Care – PPO | Attending: Emergency Medicine | Admitting: Emergency Medicine

## 2020-03-29 ENCOUNTER — Other Ambulatory Visit: Payer: Self-pay

## 2020-03-29 ENCOUNTER — Encounter (HOSPITAL_COMMUNITY): Payer: Self-pay

## 2020-03-29 DIAGNOSIS — Z20822 Contact with and (suspected) exposure to covid-19: Secondary | ICD-10-CM | POA: Insufficient documentation

## 2020-03-29 DIAGNOSIS — J028 Acute pharyngitis due to other specified organisms: Secondary | ICD-10-CM | POA: Insufficient documentation

## 2020-03-29 DIAGNOSIS — B9789 Other viral agents as the cause of diseases classified elsewhere: Secondary | ICD-10-CM | POA: Insufficient documentation

## 2020-03-29 DIAGNOSIS — J029 Acute pharyngitis, unspecified: Secondary | ICD-10-CM | POA: Diagnosis present

## 2020-03-29 DIAGNOSIS — F17210 Nicotine dependence, cigarettes, uncomplicated: Secondary | ICD-10-CM | POA: Insufficient documentation

## 2020-03-29 DIAGNOSIS — I10 Essential (primary) hypertension: Secondary | ICD-10-CM | POA: Diagnosis not present

## 2020-03-29 DIAGNOSIS — Z79899 Other long term (current) drug therapy: Secondary | ICD-10-CM | POA: Insufficient documentation

## 2020-03-29 LAB — GROUP A STREP BY PCR: Group A Strep by PCR: NOT DETECTED

## 2020-03-29 MED ORDER — ACETAMINOPHEN 325 MG PO TABS
650.0000 mg | ORAL_TABLET | Freq: Once | ORAL | Status: AC
  Administered 2020-03-29: 650 mg via ORAL
  Filled 2020-03-29: qty 2

## 2020-03-29 MED ORDER — IBUPROFEN 800 MG PO TABS
800.0000 mg | ORAL_TABLET | Freq: Once | ORAL | Status: AC
  Administered 2020-03-29: 800 mg via ORAL
  Filled 2020-03-29: qty 1

## 2020-03-29 NOTE — ED Triage Notes (Signed)
Pt reports abdominal discomfort and loose stool starting Tuesday. Denies blood. States that sore throat started yesterday. Reports noting white spots in throat today. Low grade fever.

## 2020-03-29 NOTE — ED Provider Notes (Signed)
[facility] DEPT Provider Note   CSN: [id number] Arrival date & time: 03/29/20  2027     History Chief Complaint  Patient presents with  . Sore Throat    Martin [name] is a 49 y.o. male.  HPI Patient is a 49 year old male with a medical history as noted below.  Patient states about 3 days ago began experiencing watery brown diarrhea as well as diffuse abdominal pain.  His diarrhea has alleviated and he notes moderate alleviation of his abdominal pain.  The last 24 hours he began experiencing additional mild diffuse headache, sore throat, left ear pain, subjective fevers, chills, body aches.  He denies rhinorrhea, congestion, eye pain, chest pain, shortness of breath, urinary changes, syncope.    Past Medical History:  Diagnosis Date  . HSV (herpes simplex virus) infection   . Hyperlipidemia   . Hypertension     Patient Active Problem List   Diagnosis Date Noted  . Tobacco abuse 08/13/2014  . Scalp mass 05/31/2012  . 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 HYPERTENSION 12/20/2008  . LUNG NODULE 12/20/2008    Past Surgical History:  Procedure Laterality Date  . ANKLE SURGERY  02/14/2008   Right Leg/Right Ankle  . APPENDECTOMY    . EAR CYST EXCISION N/A 04/17/2013   Procedure: CYST REMOVAL ON SCALP X2, BACK;  Surgeon: Harl Bowie, MD;  Location: [facility];  Service: General;  Laterality: N/A;  head and back       History reviewed. No pertinent family history.  Social History   Tobacco Use  . Smoking status: Current Every Day Smoker    Packs/day: 0.50    Years: 12.00    Pack years: 6.00    Types: Cigarettes  . Smokeless tobacco: Never Used  Substance Use Topics  . Alcohol use: Yes    Comment: Twice/week.  . Drug use: No    Home Medications Prior to Admission medications   Medication Sig Start Date End Date Taking? Authorizing Provider  amLODipine (NORVASC) 10 MG tablet Take 10 mg by mouth at bedtime.  03/28/19   [provider]  hydrochlorothiazide (MICROZIDE) 12.5 MG capsule TAKE 1 CAPSULE BY MOUTH ONCE DAILY IN THE MORNING 05/18/19   [provider]  lisinopril (ZESTRIL) 20 MG tablet Take 20 mg by mouth daily. 01/13/19   [provider]  meloxicam (MOBIC) 15 MG tablet Take 1 tablet (15 mg total) by mouth daily. 06/12/19   Hyatt, Max T, DPM  mupirocin ointment (BACTROBAN) 2 % APPLY OINTMENT TOPICALLY TO AFFECTED AREA ONCE DAILY AS NEEDED 06/08/19   [provider]  NOREL AD 4-10-325 MG TABS TAKE 1 TABLET BY MOUTH 4 TIMES DAILY 05/19/19   [provider]  valACYclovir (VALTREX) 1000 MG tablet Take 1 tablet (1,000 mg total) by mouth daily. 12/07/12   Copland, Gay Filler, MD  valsartan (DIOVAN) 80 MG tablet Take 80 mg by mouth daily. 05/28/19   [provider]    Allergies    Patient has no known allergies.  Review of Systems   Review of Systems  All other systems reviewed and are negative. Ten systems reviewed and are negative for acute change, except as noted in the HPI.    Physical Exam Updated Vital Signs BP (!) 150/104 (BP Location: Left Arm)   Pulse 99   Temp 99.8 F (37.7 C) (Oral)   Resp 18   Ht 6' (1.829 m)   Wt (!) 93.7 kg  SpO2 97%   BMI 28.01 kg/m   Physical Exam Vitals and nursing note reviewed.  Constitutional:      General: He is not in acute distress.    Appearance: Normal appearance. He is not ill-appearing, toxic-appearing or diaphoretic.  HENT:     Head: Normocephalic and atraumatic.     Right Ear: External ear normal.     Left Ear: External ear normal.     Nose: Nose normal.     Mouth/Throat:     Mouth: Mucous membranes are moist. No oral lesions.     Pharynx: Uvula midline. Oropharyngeal exudate and posterior oropharyngeal erythema present. No pharyngeal swelling or uvula swelling.     Tonsils: Tonsillar exudate present. No tonsillar abscesses. 1+ on the right. 1+ on the left.     Comments: Uvula midline.   Erythema noted in the posterior oropharynx.  Mild symmetrical tonsillar hypertrophy noted with erythema as well as exudates bilaterally. Eyes:     Extraocular Movements: Extraocular movements intact.  Cardiovascular:     Rate and Rhythm: Normal rate.     Pulses: Normal pulses.     Heart sounds: Normal heart sounds.  Pulmonary:     Effort: Pulmonary effort is normal.  Abdominal:     General: Abdomen is flat.     Palpations: Abdomen is soft.     Tenderness: There is abdominal tenderness.     Comments: Very mild tenderness noted with deep palpation diffusely across the periumbilical region and lower abdomen.  Well-healed surgical scar noted in the right lower quadrant from prior appendectomy.  Musculoskeletal:        General: Normal range of motion.     Cervical back: Normal range of motion and neck supple. No tenderness.  Skin:    General: Skin is warm and dry.  Neurological:     General: No focal deficit present.     Mental Status: He is alert and oriented to person, place, and time.  Psychiatric:        Mood and Affect: Mood normal.        Behavior: Behavior normal.    ED Results / Procedures / Treatments   Labs (all labs ordered are listed, but only abnormal results are displayed) Labs Reviewed  GROUP A STREP BY PCR  SARS CORONAVIRUS 2 BY RT PCR (HOSPITAL ORDER, [facility])    EKG None  Radiology No results found.  Procedures Procedures (including critical care time)  Medications Ordered in ED Medications  dexamethasone (DECADRON) injection 10 mg (has no administration in time range)  acetaminophen (TYLENOL) tablet 650 mg (650 mg Oral Given 03/29/20 2256)  ibuprofen (ADVIL) tablet 800 mg (800 mg Oral Given 03/29/20 2256)    ED Course  I have reviewed the triage vital signs and the nursing notes.  Pertinent labs & imaging results that were available during my care of the patient were reviewed by me and considered in my medical decision  making (see chart for details).    MDM Rules/Calculators/A&P                          Pt is a 49 y.o. male that present with a history, physical exam, ED Clinical Course as noted above.   Patient presents today with what appears to be a viral pharyngitis.  I tested patient for both COVID-19 as well as strep.  Rapid strep test was negative.  COVID-19 test pending.  Discussed how  patient can check these results tomorrow on MyChart.  Patient had a low-grade fever 99.8 Fahrenheit when he first arrived.  Patient was given ibuprofen as well as Tylenol and had significant relief of his fever, headache, body aches.  Patient given a dose of IM Decadron just prior to discharge.  Will discharge on a short course of prednisone.  Recommended continued use of Tylenol as well as ibuprofen for management of his symptoms.  We discussed dosing.  We discussed multiple over-the-counter medications for symptoms.  His questions were answered and he was amicable to time of discharge.  His vital signs are stable.  Patient discharged to home/self care.  Condition at discharge: Stable  Note: Portions of this report may have been transcribed using voice recognition software. Every effort was made to ensure accuracy; however, inadvertent computerized transcription errors may be present.   Final Clinical Impression(s) / ED Diagnoses Final diagnoses:  Viral pharyngitis    Rx / DC Orders ED Discharge Orders         Ordered    predniSONE (DELTASONE) 10 MG tablet  Daily with breakfast     Discontinue  Reprint     03/30/20 0006    ibuprofen (ADVIL) 800 MG tablet  Every 8 hours PRN     Discontinue  Reprint     03/30/20 0011           Rayna Sexton, PA-C 03/30/20 0017    Virgel Manifold, MD 03/30/20 2245

## 2020-03-30 LAB — SARS CORONAVIRUS 2 BY RT PCR (HOSPITAL ORDER, PERFORMED IN ~~LOC~~ HOSPITAL LAB): SARS Coronavirus 2: NEGATIVE

## 2020-03-30 MED ORDER — DEXAMETHASONE SODIUM PHOSPHATE 10 MG/ML IJ SOLN
10.0000 mg | Freq: Once | INTRAMUSCULAR | Status: AC
  Administered 2020-03-30: 10 mg via INTRAMUSCULAR
  Filled 2020-03-30: qty 1

## 2020-03-30 MED ORDER — PREDNISONE 10 MG PO TABS: 40.0000 mg | ORAL_TABLET | Freq: Every day | ORAL | 0 refills | Status: AC

## 2020-03-30 MED ORDER — IBUPROFEN 800 MG PO TABS: 800.0000 mg | ORAL_TABLET | Freq: Three times a day (TID) | ORAL | 0 refills | Status: DC | PRN

## 2021-01-13 ENCOUNTER — Other Ambulatory Visit: Payer: Self-pay | Admitting: Nurse Practitioner

## 2021-01-13 ENCOUNTER — Ambulatory Visit
Admission: RE | Admit: 2021-01-13 | Discharge: 2021-01-13 | Disposition: A | Discharge status: Home / Self Care | Payer: BC Managed Care – PPO | Source: Ambulatory Visit | Attending: Nurse Practitioner | Admitting: Nurse Practitioner

## 2021-01-13 ENCOUNTER — Other Ambulatory Visit: Payer: Self-pay

## 2021-01-13 DIAGNOSIS — M25562 Pain in left knee: Secondary | ICD-10-CM

## 2021-01-13 DIAGNOSIS — M25512 Pain in left shoulder: Secondary | ICD-10-CM

## 2021-01-31 ENCOUNTER — Other Ambulatory Visit: Payer: Self-pay | Admitting: Podiatry

## 2021-05-15 ENCOUNTER — Other Ambulatory Visit: Payer: Self-pay | Admitting: Family Medicine

## 2021-05-15 DIAGNOSIS — I493 Ventricular premature depolarization: Secondary | ICD-10-CM

## 2021-05-16 ENCOUNTER — Inpatient Hospital Stay: Payer: BC Managed Care – PPO

## 2021-05-16 ENCOUNTER — Other Ambulatory Visit: Payer: Self-pay

## 2021-05-16 DIAGNOSIS — I493 Ventricular premature depolarization: Secondary | ICD-10-CM

## 2021-05-30 ENCOUNTER — Other Ambulatory Visit: Payer: Self-pay | Admitting: Family Medicine

## 2021-05-30 ENCOUNTER — Other Ambulatory Visit: Payer: Self-pay

## 2021-05-30 ENCOUNTER — Ambulatory Visit
Admission: RE | Admit: 2021-05-30 | Discharge: 2021-05-30 | Disposition: A | Discharge status: Home / Self Care | Payer: BC Managed Care – PPO | Source: Ambulatory Visit | Attending: Family Medicine | Admitting: Family Medicine

## 2021-05-30 DIAGNOSIS — D1430 Benign neoplasm of unspecified bronchus and lung: Secondary | ICD-10-CM

## 2021-10-17 ENCOUNTER — Other Ambulatory Visit: Payer: Self-pay | Admitting: Podiatry

## 2021-11-03 ENCOUNTER — Other Ambulatory Visit (HOSPITAL_COMMUNITY)
Admission: RE | Admit: 2021-11-03 | Discharge: 2021-11-03 | Disposition: A | Discharge status: Home / Self Care | Payer: BC Managed Care – PPO | Source: Ambulatory Visit | Attending: Family Medicine | Admitting: Family Medicine

## 2021-11-03 ENCOUNTER — Other Ambulatory Visit: Payer: Self-pay | Admitting: Family Medicine

## 2021-11-03 DIAGNOSIS — Z113 Encounter for screening for infections with a predominantly sexual mode of transmission: Secondary | ICD-10-CM | POA: Diagnosis present

## 2021-11-06 LAB — MOLECULAR ANCILLARY ONLY
Bacterial Vaginitis (gardnerella): NEGATIVE
Candida Glabrata: NEGATIVE
Candida Vaginitis: NEGATIVE
Chlamydia: POSITIVE — AB
Comment: NEGATIVE
Comment: NEGATIVE
Comment: NEGATIVE
Comment: NEGATIVE
Comment: NEGATIVE
Comment: NORMAL
Neisseria Gonorrhea: NEGATIVE
Trichomonas: NEGATIVE

## 2021-12-06 ENCOUNTER — Emergency Department (HOSPITAL_COMMUNITY)
Admission: EM | Admit: 2021-12-06 | Discharge: 2021-12-07 | Disposition: A | Discharge status: Home / Self Care | Payer: BC Managed Care – PPO | Attending: Emergency Medicine | Admitting: Emergency Medicine

## 2021-12-06 ENCOUNTER — Other Ambulatory Visit: Payer: Self-pay

## 2021-12-06 ENCOUNTER — Emergency Department (HOSPITAL_COMMUNITY): Payer: BC Managed Care – PPO

## 2021-12-06 ENCOUNTER — Encounter (HOSPITAL_COMMUNITY): Payer: Self-pay | Admitting: *Deleted

## 2021-12-06 DIAGNOSIS — R0602 Shortness of breath: Secondary | ICD-10-CM

## 2021-12-06 DIAGNOSIS — Z720 Tobacco use: Secondary | ICD-10-CM

## 2021-12-06 DIAGNOSIS — Z789 Other specified health status: Secondary | ICD-10-CM

## 2021-12-06 DIAGNOSIS — F17200 Nicotine dependence, unspecified, uncomplicated: Secondary | ICD-10-CM | POA: Insufficient documentation

## 2021-12-06 DIAGNOSIS — I10 Essential (primary) hypertension: Secondary | ICD-10-CM

## 2021-12-06 DIAGNOSIS — E876 Hypokalemia: Secondary | ICD-10-CM

## 2021-12-06 DIAGNOSIS — R911 Solitary pulmonary nodule: Secondary | ICD-10-CM

## 2021-12-06 DIAGNOSIS — F1090 Alcohol use, unspecified, uncomplicated: Secondary | ICD-10-CM | POA: Diagnosis not present

## 2021-12-06 DIAGNOSIS — Z79899 Other long term (current) drug therapy: Secondary | ICD-10-CM | POA: Diagnosis not present

## 2021-12-06 LAB — COMPREHENSIVE METABOLIC PANEL
ALT: 17 U/L (ref 0–44)
AST: 19 U/L (ref 15–41)
Albumin: 4.1 g/dL (ref 3.5–5.0)
Alkaline Phosphatase: 62 U/L (ref 38–126)
Anion gap: 9 (ref 5–15)
BUN: 13 mg/dL (ref 6–20)
CO2: 24 mmol/L (ref 22–32)
Calcium: 8.7 mg/dL — ABNORMAL LOW (ref 8.9–10.3)
Chloride: 106 mmol/L (ref 98–111)
Creatinine, Ser: 0.99 mg/dL (ref 0.61–1.24)
GFR, Estimated: >60 mL/min (ref >60)
Glucose, Bld: 88 mg/dL (ref 70–99)
Potassium: 3.1 mmol/L — ABNORMAL LOW (ref 3.5–5.1)
Sodium: 139 mmol/L (ref 135–145)
Total Bilirubin: 0.4 mg/dL (ref 0.3–1.2)
Total Protein: 7.1 g/dL (ref 6.5–8.1)

## 2021-12-06 LAB — CBC
HCT: 42.8 % (ref 39.0–52.0)
Hemoglobin: 14.5 g/dL (ref 13.0–17.0)
MCH: 29.5 pg (ref 26.0–34.0)
MCHC: 33.9 g/dL (ref 30.0–36.0)
MCV: 87 fL (ref 80.0–100.0)
Platelets: 244 10*3/uL (ref 150–400)
RBC: 4.92 MIL/uL (ref 4.22–5.81)
RDW: 13.8 % (ref 11.5–15.5)
WBC: 7.5 10*3/uL (ref 4.0–10.5)
nRBC: 0 % (ref 0.0–0.2)

## 2021-12-06 LAB — D-DIMER, QUANTITATIVE: D-Dimer, Quant: 0.27 ug/mL-FEU (ref 0.00–0.50)

## 2021-12-06 LAB — BRAIN NATRIURETIC PEPTIDE: B Natriuretic Peptide: 5.9 pg/mL (ref 0.0–100.0)

## 2021-12-06 LAB — ETHANOL: Alcohol, Ethyl (B): 74 mg/dL — ABNORMAL HIGH (ref <10)

## 2021-12-06 LAB — TROPONIN I (HIGH SENSITIVITY): Troponin I (High Sensitivity): 4 ng/L (ref <18)

## 2021-12-06 MED ORDER — ALBUTEROL SULFATE (2.5 MG/3ML) 0.083% IN NEBU
5.0000 mg | INHALATION_SOLUTION | Freq: Once | RESPIRATORY_TRACT | Status: AC
  Administered 2021-12-06: 5 mg via RESPIRATORY_TRACT
  Filled 2021-12-06: qty 6

## 2021-12-06 MED ORDER — ALBUTEROL SULFATE HFA 108 (90 BASE) MCG/ACT IN AERS: 2.0000 | INHALATION_SPRAY | RESPIRATORY_TRACT | 1 refills | Status: DC | PRN

## 2021-12-06 MED ORDER — POTASSIUM CHLORIDE CRYS ER 20 MEQ PO TBCR: 20.0000 meq | EXTENDED_RELEASE_TABLET | Freq: Every day | ORAL | 0 refills | Status: DC

## 2021-12-06 MED ORDER — POTASSIUM CHLORIDE CRYS ER 20 MEQ PO TBCR
40.0000 meq | EXTENDED_RELEASE_TABLET | Freq: Once | ORAL | Status: AC
  Administered 2021-12-06: 40 meq via ORAL
  Filled 2021-12-06 (×2): qty 2

## 2021-12-06 MED ORDER — IPRATROPIUM BROMIDE 0.02 % IN SOLN
0.5000 mg | Freq: Once | RESPIRATORY_TRACT | Status: AC
  Administered 2021-12-06: 0.5 mg via RESPIRATORY_TRACT
  Filled 2021-12-06: qty 2.5

## 2021-12-06 NOTE — ED Provider Notes (Signed)
Pt care assumed at 2300.  Pt here following episode of SOB and chest tightness while at the [facility].  Sxs resolved at time of ED assessment.  Pt with sinus rhythm, frequent PVCs on monitor resting comfortably in the strectcher.  Lungs with good air movement bilaterally.  No lower extremity edema.  Plan to check repeat troponin.   ? ?Troponin is within normal limits.  Patient has been asymptomatic during his ED stay.  Discussed importance of PCP follow-up for further evaluation.  He does have some concern that this may be anxiety induced.  Will prescribe as needed hydroxyzine but feel he still needs further work-up to rule out other etiology of his symptoms. ?  ?Quintella Reichert, MD ?12/07/21 [phone] ? ?

## 2021-12-06 NOTE — Discharge Instructions (Addendum)
It was our pleasure to provide your ER care today - we hope that you feel better. ? ?Avoid any smoking. Use albuterol inhaler as need.  ? ?Follow up with cardiologist in the next 1-2 weeks - call office Monday to arrange appointment.  ? ?From today's labs, your potassium level is mildly low - eat plenty of fruits and vegetables, take potassium supplement as prescribed, and follow up with primary care doctor in one week.  ? ?Your chest xray was read as showing a 1.4 cm nodule in the left lung base - discuss with primary care doctor and have them do follow up xray or scan in 1-2 months.  ? ?Return to ER if worse, new symptoms, chest pain, increased trouble breathing, or other concern.  ?

## 2021-12-06 NOTE — ED Triage Notes (Signed)
PT here via [facility] for sob and chest pain.  PVC's and bigeminy on monitor.  80% on RA.  6L [place] increased sats to 92%.  Placed on nrb. ? ?324 asa given en-route.  750 ns given.  ETOH last night and today. ? ?Cbg 106/  Dr recently started pt on ozempic for weight loss. ?

## 2021-12-06 NOTE — ED Provider Notes (Signed)
?[facility] ?Provider Note ? ? ?CSN: [id number] ?Arrival date & time: 12/06/21  2118 ? ?  ? ?History ? ?Chief Complaint  ?Patient presents with  ? Shortness of Breath  ? Chest Pain  ? ? ?Martin [name] is a 51 y.o. male. ? ?Pt with c/o sob this evening. EMS notes low pulse ox on their arrival.  By EMS note, indicates given asa and ns bolus, also note etoh use today and yesterday. Pt denies chest pain. No cough or sore throat. No fever or chills. Denies leg pain or swelling. Notes intermittent sob in past several weeks - pt not sure of cause, states it will resolve eventually. +smoker. No hx cad, and denies fam hx cad. No hx dvt or pe.  Denies problems w allergies/pollen. Denies prior hx asthma or copd.  ? ?The history is provided by the patient, medical records and the EMS personnel.  ?Shortness of Breath ?Associated symptoms: chest pain   ?Associated symptoms: no abdominal pain, no fever, no headaches, no neck pain, no rash, no sore throat and no vomiting   ?Chest Pain ?Associated symptoms: shortness of breath   ?Associated symptoms: no abdominal pain, no back pain, no fever, no headache, no palpitations and no vomiting   ? ?  ? ?Home Medications ?Prior to Admission medications   ?Medication Sig Start Date End Date Taking? Authorizing Provider  ?amLODipine (NORVASC) 10 MG tablet Take 10 mg by mouth at bedtime. 03/28/19   [provider]  ?hydrochlorothiazide (MICROZIDE) 12.5 MG capsule TAKE 1 CAPSULE BY MOUTH ONCE DAILY IN THE MORNING 05/18/19   [provider]  ?lisinopril (ZESTRIL) 20 MG tablet Take 20 mg by mouth daily. 01/13/19   [provider]  ?meloxicam (MOBIC) 15 MG tablet Take 1 tablet by mouth once daily 10/20/21   Hyatt, Max T, DPM  ?mupirocin ointment (BACTROBAN) 2 % APPLY OINTMENT TOPICALLY TO AFFECTED AREA ONCE DAILY AS NEEDED 06/08/19   [provider]  ?NOREL AD 4-10-325 MG TABS TAKE 1 TABLET BY MOUTH 4 TIMES DAILY 05/19/19    [provider]  ?valACYclovir (VALTREX) 1000 MG tablet Take 1 tablet (1,000 mg total) by mouth daily. 12/07/12   Copland, Gay Filler, MD  ?valsartan (DIOVAN) 80 MG tablet Take 80 mg by mouth daily. 05/28/19   [provider]  ?   ? ?Allergies    ?Patient has no known allergies.   ? ?Review of Systems   ?Review of Systems  ?Constitutional:  Negative for fever.  ?HENT:  Negative for sore throat.   ?Eyes:  Negative for redness.  ?Respiratory:  Positive for shortness of breath.   ?Cardiovascular:  Positive for chest pain. Negative for palpitations and leg swelling.  ?Gastrointestinal:  Positive for diarrhea. Negative for abdominal pain and vomiting.  ?Genitourinary:  Negative for flank pain.  ?Musculoskeletal:  Negative for back pain and neck pain.  ?Skin:  Negative for rash.  ?Neurological:  Negative for headaches.  ?Hematological:  Does not bruise/bleed easily.  ?Psychiatric/Behavioral:  Negative for confusion.   ? ?Physical Exam ?Updated Vital Signs ?BP (!) 158/92   Pulse 97   Resp (!) 24   Ht 1.829 m (6')   Wt 114.8 kg   SpO2 100%   BMI 34.31 kg/m?  ?Physical Exam ?Vitals and nursing note reviewed.  ?Constitutional:   ?   Appearance: Normal appearance. He is well-developed.  ?HENT:  ?   Head: Atraumatic.  ?   Nose: Nose normal.  ?  Mouth/Throat:  ?   Mouth: Mucous membranes are moist.  ?Eyes:  ?   General: No scleral icterus. ?   Conjunctiva/sclera: Conjunctivae normal.  ?   Pupils: Pupils are equal, round, and reactive to light.  ?Neck:  ?   Trachea: No tracheal deviation.  ?Cardiovascular:  ?   Rate and Rhythm: Normal rate and regular rhythm.  ?   Pulses: Normal pulses.  ?   Heart sounds: Normal heart sounds. No murmur heard. ?  No friction rub. No gallop.  ?Pulmonary:  ?   Effort: Pulmonary effort is normal. No accessory muscle usage or respiratory distress.  ?   Breath sounds: Normal breath sounds.  ?Abdominal:  ?   General: Bowel sounds are normal. There is no distension.  ?   Palpations:  Abdomen is soft.  ?   Tenderness: There is no abdominal tenderness.  ?Genitourinary: ?   Comments: No cva tenderness. ?Musculoskeletal:     ?   General: No swelling or tenderness.  ?   Cervical back: Normal range of motion and neck supple. No rigidity.  ?   Right lower leg: No edema.  ?   Left lower leg: No edema.  ?Skin: ?   General: Skin is warm and dry.  ?   Findings: No rash.  ?Neurological:  ?   Mental Status: He is alert.  ?   Comments: Alert, speech clear.   ?Psychiatric:     ?   Mood and Affect: Mood normal.  ? ? ?ED Results / Procedures / Treatments   ?Labs ?(all labs ordered are listed, but only abnormal results are displayed) ?Results for orders placed or performed during the hospital encounter of 12/06/21  ?CBC  ?Result Value Ref Range  ? WBC 7.5 4.0 - 10.5 K/uL  ? RBC 4.92 4.22 - 5.81 MIL/uL  ? Hemoglobin 14.5 13.0 - 17.0 g/dL  ? HCT 42.8 39.0 - 52.0 %  ? MCV 87.0 80.0 - 100.0 fL  ? MCH 29.5 26.0 - 34.0 pg  ? MCHC 33.9 30.0 - 36.0 g/dL  ? RDW 13.8 11.5 - 15.5 %  ? Platelets 244 150 - 400 K/uL  ? nRBC 0.0 0.0 - 0.2 %  ?Comprehensive metabolic panel  ?Result Value Ref Range  ? Sodium 139 135 - 145 mmol/L  ? Potassium 3.1 (L) 3.5 - 5.1 mmol/L  ? Chloride 106 98 - 111 mmol/L  ? CO2 24 22 - 32 mmol/L  ? Glucose, Bld 88 70 - 99 mg/dL  ? BUN 13 6 - 20 mg/dL  ? Creatinine, Ser 0.99 0.61 - 1.24 mg/dL  ? Calcium 8.7 (L) 8.9 - 10.3 mg/dL  ? Total Protein 7.1 6.5 - 8.1 g/dL  ? Albumin 4.1 3.5 - 5.0 g/dL  ? AST 19 15 - 41 U/L  ? ALT 17 0 - 44 U/L  ? Alkaline Phosphatase 62 38 - 126 U/L  ? Total Bilirubin 0.4 0.3 - 1.2 mg/dL  ? GFR, Estimated >60 >60 mL/min  ? Anion gap 9 5 - 15  ?D-dimer, quantitative  ?Result Value Ref Range  ? D-Dimer, Quant 0.27 0.00 - 0.50 ug/mL-FEU  ?Ethanol  ?Result Value Ref Range  ? Alcohol, Ethyl (B) 74 (H) <10 mg/dL  ?Brain natriuretic peptide  ?Result Value Ref Range  ? B Natriuretic Peptide 5.9 0.0 - 100.0 pg/mL  ?Troponin I (High Sensitivity)  ?Result Value Ref Range  ? Troponin I (High  Sensitivity) 4 <18 ng/L  ? ?DG Chest Portable 1  View ? ?Result Date: 12/06/2021 ?CLINICAL DATA:  Shortness of breath. EXAM: PORTABLE CHEST 1 VIEW COMPARISON:  05/30/2021, 10/25/2017. FINDINGS: The heart size and mediastinal contours are within normal limits. 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 A stable nodular density is present at the left lung base measuring 1.4 cm. Prominent interstitial markings are noted bilaterally and unchanged. No consolidation, effusion, or pneumothorax. No acute osseous abnormality. IMPRESSION: 1. No acute cardiopulmonary process. 2. Nodular density at the left lung base, better characterized on prior CT of 2019 and not significantly changed. Electronically Signed   By: Brett Fairy M.D.   On: 12/06/2021 21:53   ? ?EKG ?EKG Interpretation ? ?Date/Time:  Saturday December 06 2021 21:22:46 EDT ?Ventricular Rate:  104 ?PR Interval:  148 ?QRS Duration: 83 ?QT Interval:  351 ?QTC Calculation: 462 ?R Axis:   50 ?Text Interpretation: Sinus tachycardia with frequent Premature ventricular complexes Confirmed by Lajean Saver [phone]) on 12/06/2021 9:38:26 PM ? ?Radiology ?DG Chest Portable 1 View ? ?Result Date: 12/06/2021 ?CLINICAL DATA:  Shortness of breath. EXAM: PORTABLE CHEST 1 VIEW COMPARISON:  05/30/2021, 10/25/2017. FINDINGS: The heart size and mediastinal contours are within normal limits. 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 A stable nodular density is present at the left lung base measuring 1.4 cm. Prominent interstitial markings are noted bilaterally and unchanged. No consolidation, effusion, or pneumothorax. No acute osseous abnormality. IMPRESSION: 1. No acute cardiopulmonary process. 2. Nodular density at the left lung base, better characterized on prior CT of 2019 and not significantly changed. Electronically Signed   By: Brett Fairy M.D.   On: 12/06/2021 21:53   ? ?Procedures ?Procedures  ? ? ?Medications Ordered in ED ?Medications - No data to  display ? ?ED Course/ Medical Decision Making/ A&P ?  ?                        ?Medical Decision Making ?Problems Addressed: ?Alcohol use: acute illness or injury ?Essential hypertension: chronic illness or injury with exac

## 2021-12-07 LAB — TROPONIN I (HIGH SENSITIVITY): Troponin I (High Sensitivity): 4 ng/L (ref <18)

## 2021-12-07 LAB — RAPID URINE DRUG SCREEN, HOSP PERFORMED
Amphetamines: NOT DETECTED
Barbiturates: NOT DETECTED
Benzodiazepines: NOT DETECTED
Cocaine: NOT DETECTED
Opiates: NOT DETECTED
Tetrahydrocannabinol: NOT DETECTED

## 2021-12-07 MED ORDER — HYDROXYZINE HCL 25 MG PO TABS: 25.0000 mg | ORAL_TABLET | Freq: Three times a day (TID) | ORAL | 0 refills | Status: DC | PRN

## 2021-12-07 MED ORDER — ALBUTEROL SULFATE HFA 108 (90 BASE) MCG/ACT IN AERS
1.0000 | INHALATION_SPRAY | RESPIRATORY_TRACT | Status: DC | PRN
  Administered 2021-12-07: 2 via RESPIRATORY_TRACT
  Filled 2021-12-07: qty 6.7

## 2022-05-07 IMAGING — CR DG CHEST 2V
2 series · 2 of 2 positions shown · non-contrast
Comparison: 12/08/2016, chest CT 10/25/2017

CLINICAL DATA: 49-year-old male with benign neoplasm and a history
of smoking

EXAM:
CHEST - 2 VIEW

[w chest pa]
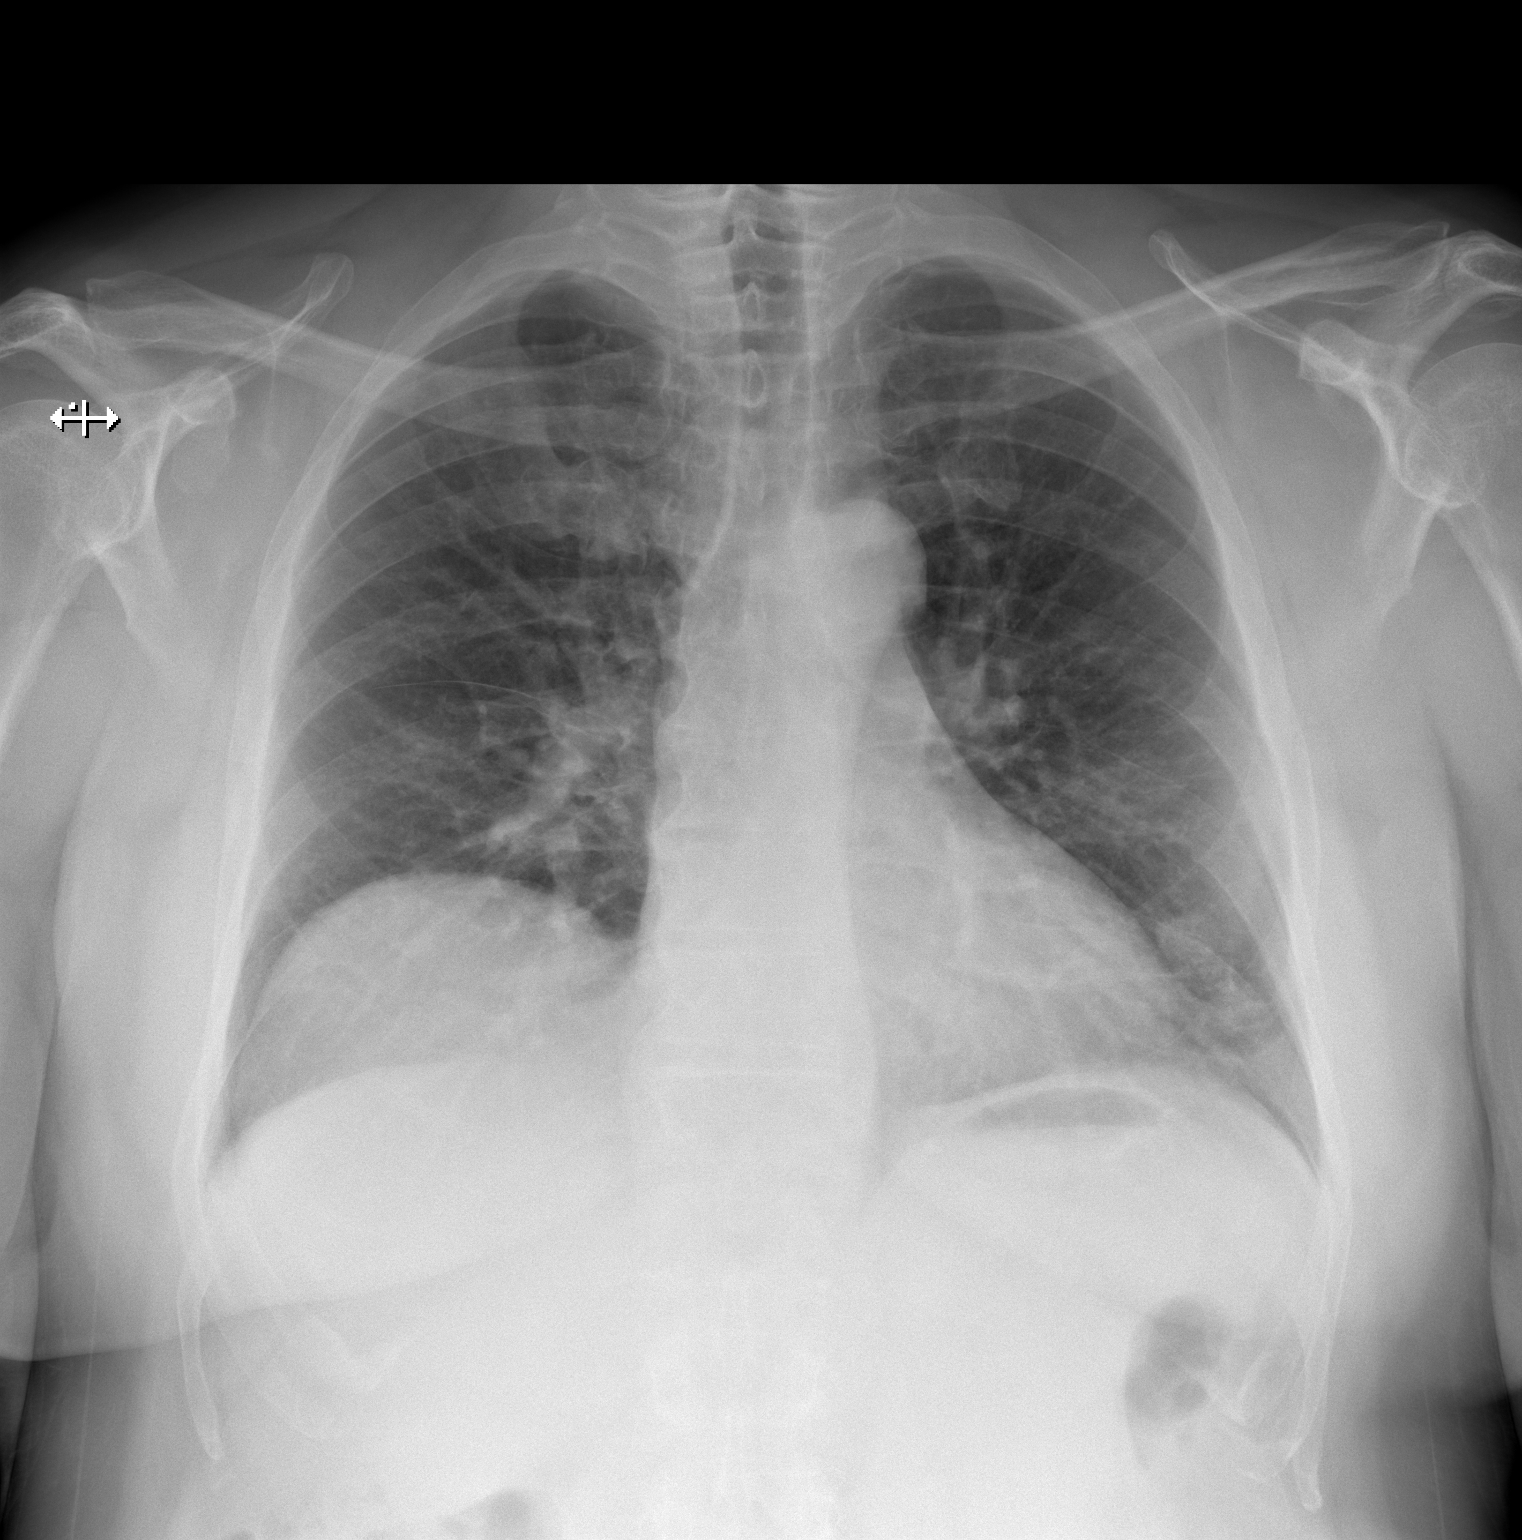

[w chest lat]
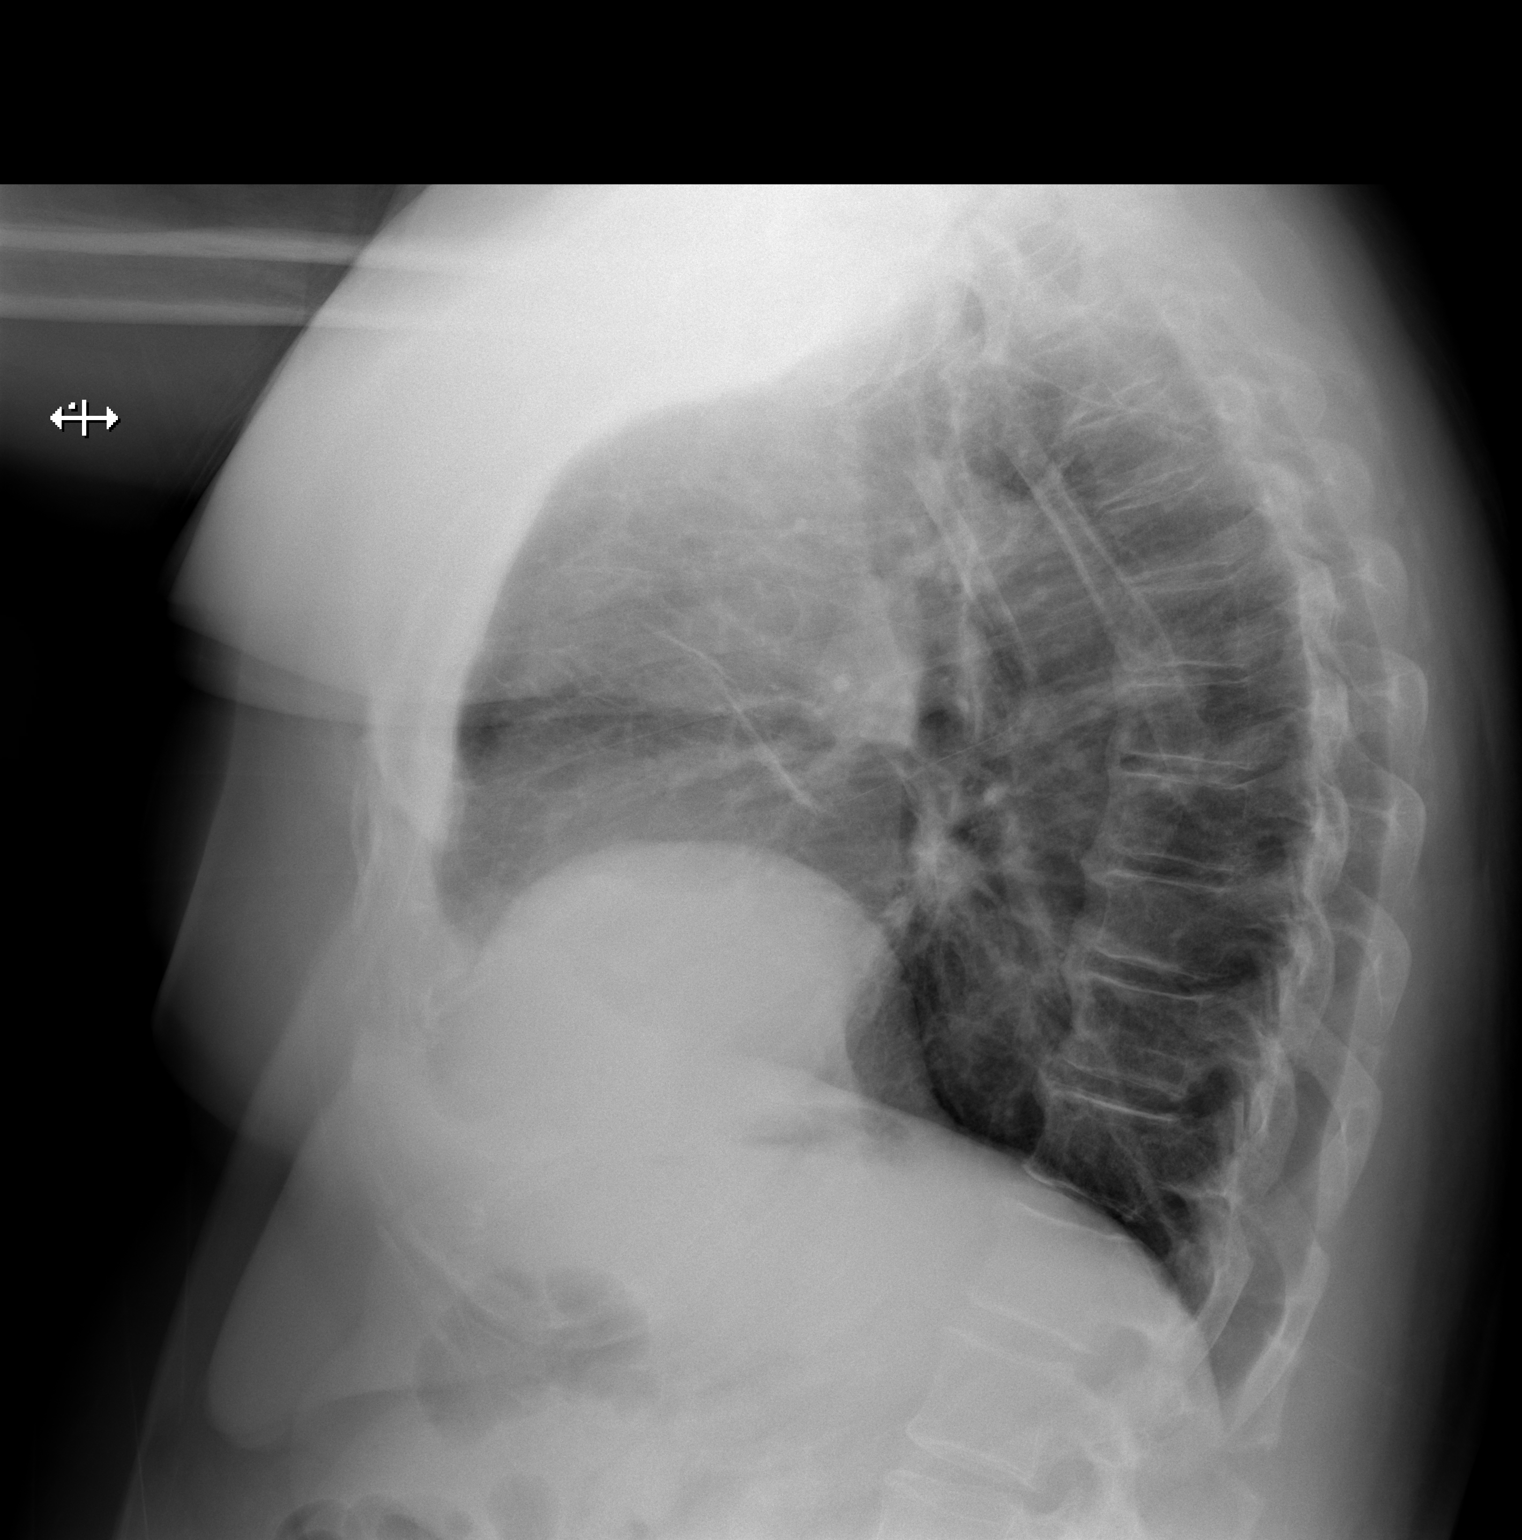

[2 of 2 positions shown; findings below may reference images not displayed]

FINDINGS: Cardiomediastinal silhouette unchanged in size and contour. No
evidence of central vascular congestion. No interlobular septal
thickening.

Low lung volumes persist with asymmetric elevation of the right
hemidiaphragm.

Nodular density at the left lung base, similar to the comparison
plain film and better characterized on prior CT 10/25/2017.

No pneumothorax or pleural effusion. Coarsened interstitial
markings, with no confluent airspace disease.

No acute displaced fracture. Degenerative changes of the spine.
IMPRESSION: Similar appearance of the chest x-ray with no definite evidence of
acute cardiopulmonary disease.

Nodule at the left lung base, better characterized on prior CT and
unchanged from prior plain film.

## 2022-06-25 ENCOUNTER — Other Ambulatory Visit: Payer: Self-pay | Admitting: Family Medicine

## 2022-06-25 DIAGNOSIS — Z720 Tobacco use: Secondary | ICD-10-CM

## 2022-06-25 DIAGNOSIS — F17210 Nicotine dependence, cigarettes, uncomplicated: Secondary | ICD-10-CM

## 2022-07-01 ENCOUNTER — Ambulatory Visit
Admission: RE | Admit: 2022-07-01 | Discharge: 2022-07-01 | Disposition: A | Discharge status: Home / Self Care | Payer: BC Managed Care – PPO | Source: Ambulatory Visit | Attending: Family Medicine | Admitting: Family Medicine

## 2022-07-01 DIAGNOSIS — F17210 Nicotine dependence, cigarettes, uncomplicated: Secondary | ICD-10-CM

## 2022-07-01 DIAGNOSIS — Z720 Tobacco use: Secondary | ICD-10-CM

## 2022-07-02 HISTORY — PX: KNEE SURGERY: SHX244

## 2022-07-28 ENCOUNTER — Emergency Department (HOSPITAL_COMMUNITY): Payer: BC Managed Care – PPO

## 2022-07-28 ENCOUNTER — Emergency Department (HOSPITAL_COMMUNITY)
Admission: EM | Admit: 2022-07-28 | Discharge: 2022-07-28 | Disposition: A | Discharge status: Home / Self Care | Payer: BC Managed Care – PPO | Attending: Emergency Medicine | Admitting: Emergency Medicine

## 2022-07-28 DIAGNOSIS — R059 Cough, unspecified: Secondary | ICD-10-CM | POA: Diagnosis present

## 2022-07-28 DIAGNOSIS — R202 Paresthesia of skin: Secondary | ICD-10-CM | POA: Insufficient documentation

## 2022-07-28 DIAGNOSIS — Z79899 Other long term (current) drug therapy: Secondary | ICD-10-CM | POA: Insufficient documentation

## 2022-07-28 DIAGNOSIS — I10 Essential (primary) hypertension: Secondary | ICD-10-CM | POA: Diagnosis not present

## 2022-07-28 DIAGNOSIS — J209 Acute bronchitis, unspecified: Secondary | ICD-10-CM | POA: Diagnosis not present

## 2022-07-28 MED ORDER — ALBUTEROL SULFATE HFA 108 (90 BASE) MCG/ACT IN AERS: 1.0000 | INHALATION_SPRAY | Freq: Four times a day (QID) | RESPIRATORY_TRACT | 0 refills | Status: DC | PRN

## 2022-07-28 MED ORDER — HYDROCODONE BIT-HOMATROP MBR 5-1.5 MG/5ML PO SOLN: 5.0000 mL | Freq: Four times a day (QID) | ORAL | 0 refills | Status: DC | PRN

## 2022-07-28 MED ORDER — PREDNISONE 20 MG PO TABS: 40.0000 mg | ORAL_TABLET | Freq: Every day | ORAL | 0 refills | Status: AC

## 2022-07-28 NOTE — Discharge Instructions (Addendum)
Your exam and work-up today was overall reassuring.  Given your previously treated with Zithromax we will treat you for bronchitis with prednisone, albuterol inhaler.  I will give you a a cough syrup to help with your cough.  Reserve this for bedtime use as it does have a narcotic component to it.  Given that your symptoms are worse at night and you are having some sore throat you likely have postnasal drip from sinus congestion.  I do recommend you use a sinus rinse.  Return for any concerning symptoms.  In regards to the tingling in your hands please schedule a follow-up appointment with your primary care provider.  They can check your A1c to ensure that you do not have uncontrolled diabetes.

## 2022-07-28 NOTE — ED Triage Notes (Signed)
Pt states he has had a cough for about 3 weeks. States the cough is worse at night.

## 2022-07-28 NOTE — ED Provider Notes (Signed)
[facility] DEPT Provider Note   CSN: [id number] Arrival date & time: 07/28/22  2637     History  Chief Complaint  Patient presents with   Cough    Martin [name] is a 51 y.o. male.  51 year old male presents today for evaluation of about 3-week duration of cough.  He states when this initially started he was treated with Zithromax.  No improvement since then.  Worse at night.  Denies sinus congestion or postnasal drip.  However does endorse some pharyngitis.  Denies fever, chest pain.  Endorses some shortness of breath with wheezing.  Denies history of COPD or asthma.  Also complains of paresthesias to both hands and feet.  Previously told he was a prediabetic.  Good strength and good range of motion.   The history is provided by the patient. No language interpreter was used.       Home Medications Prior to Admission medications   Medication Sig Start Date End Date Taking? Authorizing Provider  acetaminophen (TYLENOL) 500 MG tablet Take 1,000 mg by mouth every 6 (six) hours as needed for moderate pain or headache.    [provider]  albuterol (VENTOLIN HFA) 108 (90 Base) MCG/ACT inhaler Inhale 2 puffs into the lungs every 4 (four) hours as needed for wheezing or shortness of breath. 12/06/21   Lajean Saver, MD  amLODipine (NORVASC) 10 MG tablet Take 10 mg by mouth at bedtime. 03/28/19   [provider]  Bacitracin-Polymyxin B (NEOSPORIN EX) Apply 1 application. topically daily. Left lower leg    [provider]  doxycycline (VIBRA-TABS) 100 MG tablet Take 100 mg by mouth See admin instructions. Bid x 7 days Patient not taking: Reported on 12/06/2021 11/13/21   [provider]  hydrocortisone cream 1 % Apply 1 application. topically daily. Left lower leg    [provider]  hydrOXYzine (ATARAX) 25 MG tablet Take 1 tablet (25 mg total) by mouth every 8 (eight) hours as needed for anxiety. 12/07/21   Quintella Reichert,  MD  lisinopril (ZESTRIL) 20 MG tablet Take 20 mg by mouth daily. Patient not taking: Reported on 12/06/2021 01/13/19   [provider]  meloxicam (MOBIC) 15 MG tablet Take 1 tablet by mouth once daily Patient not taking: Reported on 12/06/2021 10/20/21   Hyatt, Max T, DPM  mupirocin ointment (BACTROBAN) 2 % Apply 1 application. topically in the morning and at bedtime. Left lower leg 06/08/19   [provider]  olmesartan-hydrochlorothiazide (BENICAR HCT) 40-25 MG tablet Take 1 tablet by mouth daily. 09/17/21   [provider]  potassium chloride SA (KLOR-CON M) 20 MEQ tablet Take 1 tablet (20 mEq total) by mouth daily. 12/06/21   Lajean Saver, MD  sildenafil (VIAGRA) 100 MG tablet Take 25 mg by mouth daily as needed for erectile dysfunction.    [provider]  valACYclovir (VALTREX) 1000 MG tablet Take 1,000 mg by mouth daily. Continuously    [provider]  valsartan (DIOVAN) 80 MG tablet Take 80 mg by mouth daily. Patient not taking: Reported on 12/06/2021 05/28/19   [provider]      Allergies    Patient has no known allergies.    Review of Systems   Review of Systems  Constitutional:  Negative for chills and fever.  HENT:  Positive for sore throat. Negative for congestion, postnasal drip, trouble swallowing and voice change.   Respiratory:  Positive for cough (dry cough). Negative for shortness of breath.  Cardiovascular:  Negative for chest pain.  Neurological:  Negative for light-headedness and headaches.  All other systems reviewed and are negative.   Physical Exam Updated Vital Signs BP (!) 157/99 (BP Location: Left Arm)   Pulse 85   Temp 98.6 F (37 C) (Oral)   Resp 18   Ht 6' (1.829 m)   Wt 113.4 kg   SpO2 98%   BMI 33.91 kg/m  Physical Exam Vitals and nursing note reviewed.  Constitutional:      General: He is not in acute distress.    Appearance: Normal appearance. He is not ill-appearing.  HENT:     Head:  Normocephalic and atraumatic.     Nose: Nose normal.  Eyes:     General: No scleral icterus.    Extraocular Movements: Extraocular movements intact.     Conjunctiva/sclera: Conjunctivae normal.  Cardiovascular:     Rate and Rhythm: Normal rate and regular rhythm.     Pulses: Normal pulses.  Pulmonary:     Effort: Pulmonary effort is normal. No respiratory distress.     Breath sounds: Normal breath sounds. No wheezing or rales.  Musculoskeletal:        General: Normal range of motion.     Cervical back: Normal range of motion.  Skin:    General: Skin is warm and dry.  Neurological:     General: No focal deficit present.     Mental Status: He is alert and oriented to person, place, and time. Mental status is at baseline.     Sensory: No sensory deficit.     ED Results / Procedures / Treatments   Labs (all labs ordered are listed, but only abnormal results are displayed) Labs Reviewed - No data to display  EKG None  Radiology DG Chest 2 View  Result Date: 07/28/2022 CLINICAL DATA:  Cough EXAM: CHEST - 2 VIEW COMPARISON:  CXR 12/06/21, CT Lung cancer screening 07/01/22 FINDINGS: No pleural effusion. No pneumothorax. Unchanged cardiac and mediastinal contours. No new focal airspace opacity. Unchanged nodular opacity new left lower lobe, better seen on CT lung cancer screening dated 07/01/2022. Unchanged appearance of the right hemidiaphragm. Vertebral body heights are maintained. No displaced rib fractures. Visualized upper abdomen is unremarkable. IMPRESSION: 1. No acute cardiopulmonary process. 2. Unchanged nodular opacity in the left lower lobe, better seen on CT lung cancer screening dated 07/01/2022. Electronically Signed   By: Marin Roberts M.D.   On: 07/28/2022 08:20    Procedures Procedures    Medications Ordered in ED Medications - No data to display  ED Course/ Medical Decision Making/ A&P                           Medical Decision Making Amount and/or Complexity  of Data Reviewed Radiology: ordered.  Risk Prescription drug management.   Medical Decision Making / ED Course   This patient presents to the ED for concern of cough, this involves an extensive number of treatment options, and is a complaint that carries with it a high risk of complications and morbidity.  The differential diagnosis includes bronchitis, postinfectious cough, cough from postnasal drip, pneumonia, viral URI  MDM: 51 year old male presents today for evaluation of 3-week duration of cough.  No chest pain.  Chest x-ray without evidence of pneumonia or other acute process.  Lung nodule noted however this is previously known and has routine CT scans for lung cancer screening.  Given he has some pharyngitis  and cough is worse at night I do suspect that he has postnasal drip from sinus congestion.  Discussed importance of sinus rinse.  He endorses occasional wheezing.  Do not appreciate any on exam today.  We will treat for bronchitis as well given duration of symptoms with prednisone and albuterol inhaler.  Given the severity of cough and sleep disturbance will give Hycodan.  Discussed that it could make him drowsy given he has a narcotic component.  He was previously treated with Zithromax a couple weeks ago.  Do not feel we need to repeat this.  Does note generalized paresthesias to the upper and lower extremities.  Sensation intact.  Good range of motion all extremities.  States he was told he was prediabetic.  Discussed with primary care provider to have A1c and other blood work done.  No emergent concern regarding this today.  Patient is appropriate for discharge.  Discharged in stable condition.  Return precautions discussed.  Lab Tests: -I ordered, reviewed, and interpreted labs.   The pertinent results include:   Labs Reviewed - No data to display    EKG  EKG Interpretation  Date/Time:    Ventricular Rate:    PR Interval:    QRS Duration:   QT Interval:    QTC  Calculation:   R Axis:     Text Interpretation:           Imaging Studies ordered: I ordered imaging studies including chest x-ray I independently visualized and interpreted imaging. I agree with the radiologist interpretation   Medicines ordered and prescription drug management: Meds ordered this encounter  Medications   predniSONE (DELTASONE) 20 MG tablet    Sig: Take 2 tablets (40 mg total) by mouth daily with breakfast for 5 days.    Dispense:  10 tablet    Refill:  0    Order Specific Question:   Supervising Provider    Answer:   Sabra Heck, BRIAN [[id number]]   albuterol (VENTOLIN HFA) 108 (90 Base) MCG/ACT inhaler    Sig: Inhale 1-2 puffs into the lungs every 6 (six) hours as needed for wheezing or shortness of breath.    Dispense:  6.7 g    Refill:  0    Order Specific Question:   Supervising Provider    Answer:   MILLER, BRIAN [[id number]]   HYDROcodone bit-homatropine (HYCODAN) 5-1.5 MG/5ML syrup    Sig: Take 5 mLs by mouth every 6 (six) hours as needed for cough.    Dispense:  120 mL    Refill:  0    Order Specific Question:   Supervising Provider    Answer:   MILLER, BRIAN [[id number]]    -I have reviewed the patients home medicines and have made adjustments as needed  Reevaluation: After the interventions noted above, I reevaluated the patient and found that they have :stayed the same  Co morbidities that complicate the patient evaluation  Past Medical History:  Diagnosis Date   HSV (herpes simplex virus) infection    Hyperlipidemia    Hypertension       Dispostion: Patient is appropriate for discharge.  Discharged in stable condition.  Return precaution discussed.  Patient voices understanding and is in agreement with plan.   Final Clinical Impression(s) / ED Diagnoses Final diagnoses:  Acute bronchitis, unspecified organism    Rx / DC Orders ED Discharge Orders          Ordered    predniSONE (DELTASONE) 20 MG tablet  Daily with breakfast  07/28/22 1148     albuterol (VENTOLIN HFA) 108 (90 Base) MCG/ACT inhaler  Every 6 hours PRN        07/28/22 1148    HYDROcodone bit-homatropine (HYCODAN) 5-1.5 MG/5ML syrup  Every 6 hours PRN        07/28/22 1148              Evlyn Courier, PA-C 07/28/22 1202    Pattricia Boss, MD 07/29/22 [phone]

## 2022-08-12 ENCOUNTER — Encounter: Payer: Self-pay | Admitting: Pulmonary Disease

## 2022-08-12 ENCOUNTER — Ambulatory Visit (INDEPENDENT_AMBULATORY_CARE_PROVIDER_SITE_OTHER): Payer: BC Managed Care – PPO | Admitting: Pulmonary Disease

## 2022-08-12 VITALS — BP 136/74 | HR 92 | Temp 98.9°F | Ht 72.0 in | Wt 255.4 lb

## 2022-08-12 DIAGNOSIS — G4730 Sleep apnea, unspecified: Secondary | ICD-10-CM

## 2022-08-12 DIAGNOSIS — R0609 Other forms of dyspnea: Secondary | ICD-10-CM | POA: Diagnosis not present

## 2022-08-12 NOTE — Patient Instructions (Signed)
Nice to meet you  To further investigate your symptoms I ordered pulmonary  function test  and a home sleep  test.   Return to clinic in January with pulmonary function testing and see me shortly after to discuss results

## 2022-08-14 NOTE — Progress Notes (Incomplete)
$'@Patient'R$  ID: Martin [name], male    DOB: 1971/08/14, 51 y.o.   MRN: [id number]  Chief Complaint  Patient presents with  . Consult    Pt is here for consult for issues breathing at night. Started in Feb of this year per patient. Pt feels like he stops breathing at night and wakes himself up when he does it. He states it comes in spells. Never had sleep study done before. Side note patient thinks he is starting to get panic attacks now. Has been given prednsione for his breathing and he states it does help but as soon as he gets off of it he is starting to break out.     Referring provider: [name], MD  HPI:   PMH:  Smoker/ Smoking History:  Maintenance:   Pt of:   08/14/2022  - Visit     Questionaires / Pulmonary Flowsheets:   ACT:      No data to display          MMRC:     No data to display          Epworth:      No data to display          Tests:   FENO:  No results found for: "NITRICOXIDE"  PFT:     No data to display          WALK:      No data to display          Imaging: DG Chest 2 View  Result Date: 07/28/2022 CLINICAL DATA:  Cough EXAM: CHEST - 2 VIEW COMPARISON:  CXR 12/06/21, CT Lung cancer screening 07/01/22 FINDINGS: No pleural effusion. No pneumothorax. Unchanged cardiac and mediastinal contours. No new focal airspace opacity. Unchanged nodular opacity new left lower lobe, better seen on CT lung cancer screening dated 07/01/2022. Unchanged appearance of the right hemidiaphragm. Vertebral body heights are maintained. No displaced rib fractures. Visualized upper abdomen is unremarkable. IMPRESSION: 1. No acute cardiopulmonary process. 2. Unchanged nodular opacity in the left lower lobe, better seen on CT lung cancer screening dated 07/01/2022. Electronically Signed   By: Marin Roberts M.D.   On: 07/28/2022 08:20    Lab Results:  CBC    Component Value Date/Time   WBC 7.5 12/06/2021 2127   RBC 4.92 12/06/2021 2127   HGB  14.5 12/06/2021 2127   HCT 42.8 12/06/2021 2127   PLT 244 12/06/2021 2127   MCV 87.0 12/06/2021 2127   MCV 86.6 02/24/2016 1629   MCH 29.5 12/06/2021 2127   MCHC 33.9 12/06/2021 2127   RDW 13.8 12/06/2021 2127    BMET    Component Value Date/Time   NA 139 12/06/2021 2127   K 3.1 (L) 12/06/2021 2127   CL 106 12/06/2021 2127   CO2 24 12/06/2021 2127   GLUCOSE 88 12/06/2021 2127   BUN 13 12/06/2021 2127   CREATININE 0.99 12/06/2021 2127   CREATININE 0.83 02/21/2016 1451   CALCIUM 8.7 (L) 12/06/2021 2127   GFRNONAA >60 12/06/2021 2127   GFRNONAA >89 02/21/2016 1451   GFRAA >89 02/21/2016 1451    BNP    Component Value Date/Time   BNP 5.9 12/06/2021 2127    ProBNP No results found for: "PROBNP"  Specialty Problems       Pulmonary Problems   LUNG NODULE    Qualifier: Diagnosis of  By: [name] MD, Clinton D Patient states he will make appt with Dr. [name]  No Known Allergies  Immunization History  Administered Date(s) Administered  . Hepatitis B, adult 05/21/2014, 06/21/2014, 11/20/2014  . Influenza,inj,Quad PF,6+ Mos 05/21/2014    Past Medical History:  Diagnosis Date  . HSV (herpes simplex virus) infection   . Hyperlipidemia   . Hypertension     Tobacco History: Social History   Tobacco Use  Smoking Status Every Day  . Packs/day: 0.50  . Years: 12.00  . Total pack years: 6.00  . Types: Cigarettes  Smokeless Tobacco Never  Tobacco Comments   Pt states he is smoking about 3 ciggs per day now. ALS 08/12/22   Ready to quit: Not Answered Counseling given: Not Answered Tobacco comments: Pt states he is smoking about 3 ciggs per day now. ALS 08/12/22   Continue to not smoke  Outpatient Encounter Medications as of 08/12/2022  Medication Sig  . acetaminophen (TYLENOL) 500 MG tablet Take 1,000 mg by mouth every 6 (six) hours as needed for moderate pain or headache.  . albuterol (VENTOLIN HFA) 108 (90 Base) MCG/ACT inhaler Inhale 1-2 puffs into the  lungs every 6 (six) hours as needed for wheezing or shortness of breath.  Marland Kitchen amLODipine (NORVASC) 10 MG tablet Take 10 mg by mouth at bedtime.  . Bacitracin-Polymyxin B (NEOSPORIN EX) Apply 1 application. topically daily. Left lower leg  . doxycycline (VIBRA-TABS) 100 MG tablet Take 100 mg by mouth See admin instructions. Bid x 7 days  . HYDROcodone bit-homatropine (HYCODAN) 5-1.5 MG/5ML syrup Take 5 mLs by mouth every 6 (six) hours as needed for cough.  . hydrocortisone cream 1 % Apply 1 application. topically daily. Left lower leg  . hydrOXYzine (ATARAX) 25 MG tablet Take 1 tablet (25 mg total) by mouth every 8 (eight) hours as needed for anxiety.  Marland Kitchen lisinopril (ZESTRIL) 20 MG tablet Take 20 mg by mouth daily.  . meloxicam (MOBIC) 15 MG tablet Take 1 tablet by mouth once daily  . mupirocin ointment (BACTROBAN) 2 % Apply 1 application. topically in the morning and at bedtime. Left lower leg  . olmesartan-hydrochlorothiazide (BENICAR HCT) 40-25 MG tablet Take 1 tablet by mouth daily.  . potassium chloride SA (KLOR-CON M) 20 MEQ tablet Take 1 tablet (20 mEq total) by mouth daily.  . sildenafil (VIAGRA) 100 MG tablet Take 25 mg by mouth daily as needed for erectile dysfunction.  . valACYclovir (VALTREX) 1000 MG tablet Take 1,000 mg by mouth daily. Continuously  . valsartan (DIOVAN) 80 MG tablet Take 80 mg by mouth daily.   No facility-administered encounter medications on file as of 08/12/2022.     Review of Systems  Review of Systems   Physical Exam  BP 136/74 (BP Location: Left Arm, Patient Position: Sitting, Cuff Size: Normal)   Pulse 92   Temp 98.9 F (37.2 C) (Oral)   Ht 6' (1.829 m)   Wt 255 lb 6.4 oz (115.8 kg)   SpO2 96%   BMI 34.64 kg/m   Wt Readings from Last 5 Encounters:  08/12/22 255 lb 6.4 oz (115.8 kg)  07/28/22 250 lb (113.4 kg)  12/06/21 253 lb (114.8 kg)  03/29/20 (!) 206 lb 8 oz (93.7 kg)  12/10/16 251 lb (113.9 kg)    BMI Readings from Last 5 Encounters:   08/12/22 34.64 kg/m  07/28/22 33.91 kg/m  12/06/21 34.31 kg/m  03/29/20 28.01 kg/m  12/10/16 34.04 kg/m     Physical Exam    Assessment & Plan:   DOE: Episodic.  Severe.  Prednisone with relief but  then recurs.  Suspicious for asthma.  Recommend escalation ICS/LABA therapy.  Prefer further evaluation for changing medicines.  PFTs for further evaluation.  Sleep disordered breathing: Reports episodes of not breathing at night, waking up gasping for air.  His home sleep test to evaluate for OSA.   Return in about 5 weeks (around 09/16/2022).   Lanier Clam, MD 08/14/2022   This appointment required 61 minutes of patient care (this includes precharting, chart review, review of results, face-to-face care, etc.).

## 2022-08-14 NOTE — Progress Notes (Signed)
$'@Patient'T$  ID: Martin [name], male    DOB: 10-Feb-1971, 51 y.o.   MRN: [id number]  Chief Complaint  Patient presents with   Consult    Pt is here for consult for issues breathing at night. Started in Feb of this year per patient. Pt feels like he stops breathing at night and wakes himself up when he does it. He states it comes in spells. Never had sleep study done before. Side note patient thinks he is starting to get panic attacks now. Has been given prednsione for his breathing and he states it does help but as soon as he gets off of it he is starting to break out.     Referring provider: [name], MD  HPI:   51 y.o. man whom we are seeing in evaluation of shortness of breath.  Previous history of lung nodules.  Most recent pulmonary note Dr. [name] 09/2010 reviewed.  Recent ED note reviewed.  Patient reports issues with breathing for the last few months.  Particular in the evenings.  Sudden onset of chest tightness difficulty breathing.  Duration varies.  Sometimes a few seconds to a couple minutes.  Sometimes gets better on its own.  Usually with time resolved.  He has been seen and given prednisone.  He does state that this helped relieve the symptoms for a period of days but then symptoms returned.  He feels like they cause panic attacks.  He feels part of the issue is he gets panicked and things worsen.  But he clearly describes some chest discomfort and improvement with steroids.  He also thinks albuterol helps.  Not every time but sometimes.  Chest x-ray 07/2022 personally reviewed interpret as clear lungs bilaterally.    Questionaires / Pulmonary Flowsheets:   ACT:      No data to display          MMRC:     No data to display          Epworth:      No data to display          Tests:   FENO:  No results found for: "NITRICOXIDE"  PFT:     No data to display          WALK:      No data to display          Imaging: Personally reviewed and  as per EMR discussion this note DG Chest 2 View  Result Date: 07/28/2022 CLINICAL DATA:  Cough EXAM: CHEST - 2 VIEW COMPARISON:  CXR 12/06/21, CT Lung cancer screening 07/01/22 FINDINGS: No pleural effusion. No pneumothorax. Unchanged cardiac and mediastinal contours. No new focal airspace opacity. Unchanged nodular opacity new left lower lobe, better seen on CT lung cancer screening dated 07/01/2022. Unchanged appearance of the right hemidiaphragm. Vertebral body heights are maintained. No displaced rib fractures. Visualized upper abdomen is unremarkable. IMPRESSION: 1. No acute cardiopulmonary process. 2. Unchanged nodular opacity in the left lower lobe, better seen on CT lung cancer screening dated 07/01/2022. Electronically Signed   By: Marin Roberts M.D.   On: 07/28/2022 08:20    Lab Results: Personally reviewed CBC    Component Value Date/Time   WBC 7.5 12/06/2021 2127   RBC 4.92 12/06/2021 2127   HGB 14.5 12/06/2021 2127   HCT 42.8 12/06/2021 2127   PLT 244 12/06/2021 2127   MCV 87.0 12/06/2021 2127   MCV 86.6 02/24/2016 1629   MCH 29.5 12/06/2021 2127   MCHC  33.9 12/06/2021 2127   RDW 13.8 12/06/2021 2127    BMET    Component Value Date/Time   NA 139 12/06/2021 2127   K 3.1 (L) 12/06/2021 2127   CL 106 12/06/2021 2127   CO2 24 12/06/2021 2127   GLUCOSE 88 12/06/2021 2127   BUN 13 12/06/2021 2127   CREATININE 0.99 12/06/2021 2127   CREATININE 0.83 02/21/2016 1451   CALCIUM 8.7 (L) 12/06/2021 2127   GFRNONAA >60 12/06/2021 2127   GFRNONAA >89 02/21/2016 1451   GFRAA >89 02/21/2016 1451    BNP    Component Value Date/Time   BNP 5.9 12/06/2021 2127    ProBNP No results found for: "PROBNP"  Specialty Problems       Pulmonary Problems   LUNG NODULE    Qualifier: Diagnosis of  By: [name] MD, Clinton D Patient states he will make appt with Dr. [name]       No Known Allergies  Immunization History  Administered Date(s) Administered   Hepatitis B, adult  05/21/2014, 06/21/2014, 11/20/2014   Influenza,inj,Quad PF,6+ Mos 05/21/2014    Past Medical History:  Diagnosis Date   HSV (herpes simplex virus) infection    Hyperlipidemia    Hypertension     Tobacco History: Social History   Tobacco Use  Smoking Status Every Day   Packs/day: 0.50   Years: 12.00   Total pack years: 6.00   Types: Cigarettes  Smokeless Tobacco Never  Tobacco Comments   Pt states he is smoking about 3 ciggs per day now. ALS 08/12/22   Ready to quit: Not Answered Counseling given: Not Answered Tobacco comments: Pt states he is smoking about 3 ciggs per day now. ALS 08/12/22   Continue to not smoke  Outpatient Encounter Medications as of 08/12/2022  Medication Sig   acetaminophen (TYLENOL) 500 MG tablet Take 1,000 mg by mouth every 6 (six) hours as needed for moderate pain or headache.   albuterol (VENTOLIN HFA) 108 (90 Base) MCG/ACT inhaler Inhale 1-2 puffs into the lungs every 6 (six) hours as needed for wheezing or shortness of breath.   amLODipine (NORVASC) 10 MG tablet Take 10 mg by mouth at bedtime.   Bacitracin-Polymyxin B (NEOSPORIN EX) Apply 1 application. topically daily. Left lower leg   doxycycline (VIBRA-TABS) 100 MG tablet Take 100 mg by mouth See admin instructions. Bid x 7 days   HYDROcodone bit-homatropine (HYCODAN) 5-1.5 MG/5ML syrup Take 5 mLs by mouth every 6 (six) hours as needed for cough.   hydrocortisone cream 1 % Apply 1 application. topically daily. Left lower leg   hydrOXYzine (ATARAX) 25 MG tablet Take 1 tablet (25 mg total) by mouth every 8 (eight) hours as needed for anxiety.   lisinopril (ZESTRIL) 20 MG tablet Take 20 mg by mouth daily.   meloxicam (MOBIC) 15 MG tablet Take 1 tablet by mouth once daily   mupirocin ointment (BACTROBAN) 2 % Apply 1 application. topically in the morning and at bedtime. Left lower leg   olmesartan-hydrochlorothiazide (BENICAR HCT) 40-25 MG tablet Take 1 tablet by mouth daily.   potassium chloride SA  (KLOR-CON M) 20 MEQ tablet Take 1 tablet (20 mEq total) by mouth daily.   sildenafil (VIAGRA) 100 MG tablet Take 25 mg by mouth daily as needed for erectile dysfunction.   valACYclovir (VALTREX) 1000 MG tablet Take 1,000 mg by mouth daily. Continuously   valsartan (DIOVAN) 80 MG tablet Take 80 mg by mouth daily.   No facility-administered encounter medications on file as of  08/12/2022.     Review of Systems  Review of Systems  No chest pain with exertion.  No orthopnea or PND.  Comprehensive review of systems otherwise negative. Physical Exam  BP 136/74 (BP Location: Left Arm, Patient Position: Sitting, Cuff Size: Normal)   Pulse 92   Temp 98.9 F (37.2 C) (Oral)   Ht 6' (1.829 m)   Wt 255 lb 6.4 oz (115.8 kg)   SpO2 96%   BMI 34.64 kg/m   Wt Readings from Last 5 Encounters:  08/12/22 255 lb 6.4 oz (115.8 kg)  07/28/22 250 lb (113.4 kg)  12/06/21 253 lb (114.8 kg)  03/29/20 (!) 206 lb 8 oz (93.7 kg)  12/10/16 251 lb (113.9 kg)    BMI Readings from Last 5 Encounters:  08/12/22 34.64 kg/m  07/28/22 33.91 kg/m  12/06/21 34.31 kg/m  03/29/20 28.01 kg/m  12/10/16 34.04 kg/m     Physical Exam General: Sitting in chair, no acute distress Eyes: EOMI, no icterus Neck: Supple, no JVP Pulmonary: Clear, distant Cardiovascular warm, no edema MSK: No synovitis, no joint effusion Abdomen: Nondistended, bowel sounds present Neuro: Normal gait, no weakness Psych: Normal mood, full affect   Assessment & Plan:   DOE: Episodic.  Severe.  Prednisone with relief but then recurs.  Suspicious for asthma.  Recommend escalation ICS/LABA therapy.  Prefer further evaluation for changing medicines.  PFTs for further evaluation.  Sleep disordered breathing: Reports episodes of not breathing at night, waking up gasping for air.  Home sleep test to evaluate for OSA.   Return in about 5 weeks (around 09/16/2022).   Lanier Clam, MD 08/14/2022   This appointment required 61  minutes of patient care (this includes precharting, chart review, review of results, face-to-face care, etc.).

## 2022-08-21 ENCOUNTER — Telehealth: Payer: Self-pay | Admitting: Pulmonary Disease

## 2022-08-21 NOTE — Telephone Encounter (Signed)
Called and spoke with pt who states that Dr. Silas Flood had discussed with him at last OV about putting him on prednisone due to prior rash that he had. Pt was wanting to know if an Rx could possibly be sent to the pharmacy for him.  Preferred pharmacy is [facility].  Pt has been made aware that Dr. Silas Flood is not available until 12/19. Routing to Dr. Silas Flood for him to review. Please advise.

## 2022-08-25 MED ORDER — PREDNISONE 10 MG PO TABS: ORAL_TABLET | ORAL | 0 refills | Status: DC

## 2022-08-25 NOTE — Telephone Encounter (Signed)
ATC patient.  Left detailed message on VM (DPR) with Dr. Kavin Leech recommendations.  Call back left for questions or problems.  Prednisone prescription sent to requested pharmacy. Nothing further at this time.

## 2022-08-25 NOTE — Telephone Encounter (Signed)
Prednisone 20 mg for 5 days - thanks!

## 2022-09-02 ENCOUNTER — Ambulatory Visit: Payer: BC Managed Care – PPO | Admitting: Internal Medicine

## 2022-09-02 ENCOUNTER — Encounter: Payer: Self-pay | Admitting: Internal Medicine

## 2022-09-02 VITALS — BP 142/97 | HR 88 | Ht 72.0 in | Wt 252.0 lb

## 2022-09-02 DIAGNOSIS — M2540 Effusion, unspecified joint: Secondary | ICD-10-CM

## 2022-09-02 DIAGNOSIS — I10 Essential (primary) hypertension: Secondary | ICD-10-CM

## 2022-09-02 DIAGNOSIS — I493 Ventricular premature depolarization: Secondary | ICD-10-CM

## 2022-09-02 NOTE — Progress Notes (Signed)
Primary Physician/Referring:  Iona Beard, MD  Patient ID: Martin [name], male    DOB: April 01, 1971, 51 y.o.   MRN: [id number]  Chief Complaint  Patient presents with   cardiovascular risk factors   New Patient (Initial Visit)        HPI:    Martin [name]  is a 51 y.o. male with past medical history significant for hypertension and hyperlipidemia who is here to establish care with cardiology. Patient has been having swelling in his joints in addition to painful joints for a few months now. He has seen his PCP and a few other specialists so far but he does not have an answer for why this is happening. He also admits to shortness of breath and breaking out in hives. He is going to see an allergist soon. He states that nothing has changed for him in terms of diet and household products. Patient is very frustrated and does not know why he is feeling so bad. He denies any cardiac history in himself.  Past Medical History:  Diagnosis Date   HSV (herpes simplex virus) infection    Hyperlipidemia    Hypertension    Past Surgical History:  Procedure Laterality Date   ANKLE SURGERY  02/14/2008   Right Leg/Right Ankle   APPENDECTOMY     EAR CYST EXCISION N/A 04/17/2013   Procedure: CYST REMOVAL ON SCALP X2, BACK;  Surgeon: Harl Bowie, MD;  Location: [facility];  Service: General;  Laterality: N/A;  head and back   KNEE SURGERY Left 07/02/2022   History reviewed. No pertinent family history.  Social History   Tobacco Use   Smoking status: Every Day    Packs/day: 0.50    Years: 12.00    Total pack years: 6.00    Types: Cigarettes   Smokeless tobacco: Never   Tobacco comments:    Pt states he is smoking about 3 ciggs per day now. ALS 08/12/22  Substance Use Topics   Alcohol use: Yes    Comment: Twice/week.   Marital Status: Single  ROS  Review of Systems  Cardiovascular:  Positive for irregular heartbeat and palpitations.  Respiratory:  Positive for shortness of breath.    Musculoskeletal:  Positive for joint pain, joint swelling and myalgias.  Allergic/Immunologic: Positive for hives.   Objective  Blood pressure (!) 142/97, pulse 88, height 6' (1.829 m), weight 252 lb (114.3 kg), SpO2 97 %. Body mass index is 34.18 kg/m.     09/02/2022   10:45 AM 09/02/2022   10:32 AM 08/12/2022    1:19 PM  Vitals with BMI  Height  _0  _1   Weight  252 lbs 255 lbs 6 oz  BMI  42.35 36.14  Systolic 431 540 086  Diastolic 97 92 74  Pulse 88 77 92     Physical Exam Vitals reviewed.  HENT:     Head: Normocephalic and atraumatic.  Cardiovascular:     Rate and Rhythm: Normal rate and regular rhythm.     Pulses: Normal pulses.     Heart sounds: Normal heart sounds. No murmur heard. Pulmonary:     Effort: Pulmonary effort is normal.     Breath sounds: Normal breath sounds.  Abdominal:     General: Bowel sounds are normal.  Musculoskeletal:     Right lower leg: No edema.     Left lower leg: No edema.  Skin:    General: Skin is warm and dry.  Neurological:  Mental Status: He is alert.    Medications and allergies  No Known Allergies   Medication list after today's encounter   Current Outpatient Medications:    acetaminophen (TYLENOL) 500 MG tablet, Take 1,000 mg by mouth every 6 (six) hours as needed for moderate pain or headache., Disp: , Rfl:    albuterol (VENTOLIN HFA) 108 (90 Base) MCG/ACT inhaler, Inhale 1-2 puffs into the lungs every 6 (six) hours as needed for wheezing or shortness of breath., Disp: 6.7 g, Rfl: 0   amLODipine (NORVASC) 10 MG tablet, Take 10 mg by mouth at bedtime., Disp: , Rfl:    Bacitracin-Polymyxin B (NEOSPORIN EX), Apply 1 application. topically daily. Left lower leg, Disp: , Rfl:    hydrocortisone cream 1 %, Apply 1 application. topically daily. Left lower leg, Disp: , Rfl:    hydrOXYzine (ATARAX) 25 MG tablet, Take 1 tablet (25 mg total) by mouth every 8 (eight) hours as needed for anxiety., Disp: 12 tablet, Rfl: 0    meloxicam (MOBIC) 15 MG tablet, Take 1 tablet by mouth once daily, Disp: 30 tablet, Rfl: 0   mupirocin ointment (BACTROBAN) 2 %, Apply 1 application. topically in the morning and at bedtime. Left lower leg, Disp: , Rfl:    predniSONE (DELTASONE) 10 MG tablet, Take 96m daily for 5 days, Disp: 10 tablet, Rfl: 0   valACYclovir (VALTREX) 1000 MG tablet, Take 1,000 mg by mouth daily. Continuously, Disp: , Rfl:    valsartan (DIOVAN) 80 MG tablet, Take 80 mg by mouth daily., Disp: , Rfl:   Laboratory examination:   Lab Results  Component Value Date   NA 139 12/06/2021   K 3.1 (L) 12/06/2021   CO2 24 12/06/2021   GLUCOSE 88 12/06/2021   BUN 13 12/06/2021   CREATININE 0.99 12/06/2021   CALCIUM 8.7 (L) 12/06/2021   GFRNONAA >60 12/06/2021       Latest Ref Rng & Units 12/06/2021    9:27 PM 02/21/2016    2:51 PM 03/29/2015    2:35 PM  CMP  Glucose 70 - 99 mg/dL 88  96  86   BUN 6 - 20 mg/dL _0 Creatinine 0.61 - 1.24 mg/dL 0.99  0.83  0.75   Sodium 135 - 145 mmol/L 139  139  138   Potassium 3.5 - 5.1 mmol/L 3.1  4.3  4.3   Chloride 98 - 111 mmol/L 106  103  103   CO2 22 - 32 mmol/L _1 Calcium 8.9 - 10.3 mg/dL 8.7  9.3  8.8   Total Protein 6.5 - 8.1 g/dL 7.1  7.6  6.7   Total Bilirubin 0.3 - 1.2 mg/dL 0.4  0.6  0.4   Alkaline Phos 38 - 126 U/L 62  56  57   AST 15 - 41 U/L _2 ALT 0 - 44 U/L _3 Latest Ref Rng & Units 12/06/2021    9:27 PM 02/24/2016    4:29 PM 02/21/2016    3:03 PM  CBC  WBC 4.0 - 10.5 K/uL 7.5  6.4  9.8   Hemoglobin 13.0 - 17.0 g/dL 14.5  14.2  14.8   Hematocrit 39.0 - 52.0 % 42.8  40.3  43.0   Platelets 150 - 400 K/uL 244       Lipid Panel No results for input(s): "CHOL", "TRIG", "[place], "VLDL", "HDL", "  CHOLHDL", "LDLDIRECT" in the last 8760 hours.  HEMOGLOBIN A1C No results found for: "HGBA1C", "MPG" TSH No results for input(s): "TSH" in the last 8760 hours.  External labs:     Radiology:    Cardiac  Studies:     EKG:   09/02/2022: Sinus Rhythm -Short PR syndrome. Normal R wave progression. No evidence of ischemia  Assessment     ICD-10-CM   1. [name]  I49.3 EKG 12-Lead    PCV CARDIAC STRESS TEST    PCV ECHOCARDIOGRAM COMPLETE    2. Essential hypertension  I10 PCV CARDIAC STRESS TEST    PCV ECHOCARDIOGRAM COMPLETE    3. Joint swelling  M25.40 Ambulatory referral to Rheumatology    5 HIAA, quantitative, urine, 24 hour    Aldosterone + renin activity w/ ratio    Catecholamine+VMA, 24-Hr Urine    Hgb A1c w/o eAG    Lipid Panel With LDL/HDL Ratio    TSH+T4F+T3Free    VITAMIN D 25 Hydroxy (Vit-D Deficiency, Fractures)    Kappa/Lambda Light Chains, Free, With Ratio, 24Hr. Urine    Hepatic function panel    ANA+ENA+DNA/DS+Scl 70+SjoSSA/B    B12 and Folate Panel    Iron    Fe+TIBC+Fer    CBC    High sensitivity CRP    Sed Rate (ESR)       Orders Placed This Encounter  Procedures   5 HIAA, quantitative, urine, 24 hour   Aldosterone + renin activity w/ ratio   Catecholamine+VMA, 24-Hr Urine   Hgb A1c w/o eAG   Lipid Panel With LDL/HDL Ratio   TSH+T4F+T3Free   VITAMIN D 25 Hydroxy (Vit-D Deficiency, Fractures)   Kappa/Lambda Light Chains, Free, With Ratio, 24Hr. Urine   Hepatic function panel   ANA+ENA+DNA/DS+Scl 70+SjoSSA/B   B12 and Folate Panel   Iron   Fe+TIBC+Fer   CBC   High sensitivity CRP   Sed Rate (ESR)   Ambulatory referral to Rheumatology    Referral Priority:   Routine    Referral Type:   Consultation    Referral Reason:   Specialty Services Required    Requested Specialty:   Rheumatology    Number of Visits Requested:   1   PCV CARDIAC STRESS TEST    Standing Status:   Future    Number of Occurrences:   1    Standing Expiration Date:   11/03/2022   EKG 12-Lead   PCV ECHOCARDIOGRAM COMPLETE    Standing Status:   Future    Standing Expiration Date:   09/03/2023    No orders of the defined types were placed in  this encounter.   Medications Discontinued During This Encounter  Medication Reason   doxycycline (VIBRA-TABS) 100 MG tablet Completed Course   HYDROcodone bit-homatropine (HYCODAN) 5-1.5 MG/5ML syrup Completed Course   lisinopril (ZESTRIL) 20 MG tablet Change in therapy   potassium chloride SA (KLOR-CON M) 20 MEQ tablet Completed Course   olmesartan-hydrochlorothiazide (BENICAR HCT) 40-25 MG tablet Patient Preference   sildenafil (VIAGRA) 100 MG tablet Patient Preference     Recommendations:   ELLIE SPICKLER is a 51 y.o.  male with hypertension   Short PR interval on EKG Will obtain GXT to assess for underlying arrhythmia   Essential hypertension Continue current cardiac medications. He admits to white coat syndrome and also he is frustrated about his health and that is why his BP is elevated Encourage low-sodium diet, less than 2000 mg daily. Echocardiogram ordered   Joint swelling  Rheumatology referral provided Extensive labwork order for possible inflammatory/autoimmune disease  Follow-up in 1-2 months or sooner if needed    Floydene Flock, DO, [facility]  09/09/2022, 10:40 AM Office: [phone] Pager: [phone]

## 2022-09-08 ENCOUNTER — Ambulatory Visit: Payer: BC Managed Care – PPO

## 2022-09-08 DIAGNOSIS — I10 Essential (primary) hypertension: Secondary | ICD-10-CM

## 2022-09-08 DIAGNOSIS — I493 Ventricular premature depolarization: Secondary | ICD-10-CM

## 2022-09-09 NOTE — Progress Notes (Unsigned)
New Patient Note  RE: Martin [name] MRN: [id number] DOB: 07-14-1971 Date of Office Visit: 09/10/2022  Consult requested by: Iona Beard, MD Primary care provider: Iona Beard, MD  Chief Complaint: Urticaria (Started the beginning of November has not had issues for a week now. Also would have severe itching and bumps on knees/ elbows ( prednisone in 11/23 ), Allergic Rhinitis , and Allergy Testing (Would like to be tested for alpha gal changed his diet and stopped allergy medications and it seems to have helped )  History of Present Illness: I had the pleasure of seeing Martin [name] for initial evaluation at the [facility] on 09/10/2022. He is a 52 y.o. male, who is referred here by Iona Beard, MD for the evaluation of urticaria.  Rash started about 2 months ago. This initially started mainly from the waist up. Describes them as itchy, red, raised. Individual rashes lasts about less than 1 day. No ecchymosis upon resolution. Associated symptoms include: itchy palms, and maybe shortness of breath and anxiety/stress. Frequency of episodes: daily but less intense than before.  Suspected triggers are unknown. Avoiding red meat now as concerned abut alpha gal allergy but still breaking out.   Denies any fevers, chills, changes in medications, foods, personal care products or recent infections.  He had some shortness of breath in November and went to the ER. He was diagnosed with bronchitis and was given prednisone, hycodan and albuterol. Was not tested for flu/Covid at that time.   He has tried the following therapies: hydroxyzine, cetirizine with minimal benefit.   Systemic steroids yes which is the only thing that has been working.  Previous work up includes: none. Previous history of rash/hives: no. Patient is up to date with the following cancer screening tests: physical exam, colonoscopy.  Reviewed images on the phone - consistent with urticarial rash on torso,  face.   Patient follows with cardiology and pulmonology.   09/02/2022 cards visit: " Short PR interval on EKG Will obtain GXT to assess for underlying arrhythmia     Essential hypertension Continue current cardiac medications. He admits to white coat syndrome and also he is frustrated about his health and that is why his BP is elevated Encourage low-sodium diet, less than 2000 mg daily. Echocardiogram ordered     Joint swelling Rheumatology referral provided Extensive labwork order for possible inflammatory/autoimmune disease"  08/12/2022 pulm visit: "DOE: Episodic.  Severe.  Prednisone with relief but then recurs.  Suspicious for asthma.  Recommend escalation ICS/LABA therapy.  Prefer further evaluation for changing medicines.  PFTs for further evaluation.   Sleep disordered breathing: Reports episodes of not breathing at night, waking up gasping for air.  His home sleep test to evaluate for OSA."  Assessment and Plan: Martin [name] is a 52 y.o. male with: Urticaria Daily hive outbreaks x 2 months. Slowly improving but it's making him anxious and stressed. Apparently had was diagnosed with bronchitis and was treated with prednisone, hycodan and albuterol right before onset. Did not check for flu/Covid-19. Now also has some episodes of shortness of breath. Follows with pulm and cards. Tried antihistamines with minimal benefit. Only prednisone helps. Denies recent tick bites. No pets at home. Today's skin prick testing was positive to cat, horse and one mold. Negative to select foods.  I don't think the above allergens are causing the rash/hives. 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 Discussed with patient, that urticaria is usually caused by release of  histamine by cutaneous mast cells but sometimes it is non-histamine mediated. Explained that urticaria is not always associated with allergies. In most cases, the exact etiology for urticaria can not be  established and it is considered idiopathic. Start allegra (fexofenadine) '180mg'$  twice a day. If symptoms are not controlled or causes drowsiness let us know. Start Pepcid (famotidine) '20mg'$  twice a day.  May take hydroxyzine '10mg'$  to '20mg'$  every 8 hours as needed for breakthrough rash/itching. Avoid the following potential triggers: alcohol, tight clothing, NSAIDs, hot showers and getting overheated. Get bloodwork to rule out other etiologies.   Other allergic rhinitis Mild symptoms and spring.  Noticed allergic symptoms around cats and horses in the past. Today's skin prick testing was positive to cat, horse and one mold. See below for environmental control measures. The above antihistamine should also help with these symptoms. Discussed not using nasal decongestant sprays on a daily basis it can cause rebound nasal congestion.    Shortness of breath Being worked up by [employer] and cards. Follow up with them as scheduled.   Return in about 4 weeks (around 10/08/2022).  Meds ordered this encounter  Medications   famotidine (PEPCID) 20 MG tablet    Sig: Take 1 tablet (20 mg total) by mouth 2 (two) times daily.    Dispense:  60 tablet    Refill:  2   fexofenadine (ALLEGRA ALLERGY) 180 MG tablet    Sig: Take 1 tablet (180 mg total) by mouth in the morning and at bedtime.    Dispense:  60 tablet    Refill:  3   hydrOXYzine (ATARAX) 10 MG tablet    Sig: Take 1-2 tablets every 8 hours as needed for breakthrough rash/itching.    Dispense:  60 tablet    Refill:  1   Lab Orders         Alpha-Gal Panel         ANA w/Reflex         CBC with Differential/Platelet         Chronic Urticaria         C3 and C4         C-reactive protein         Comprehensive metabolic panel         Tryptase         Sedimentation rate         Thyroid Cascade Profile      Other allergy screening: Asthma:  using albuterol every 2 weeks with good benefit.  Rhino conjunctivitis: yes Mild symptoms in the  spring.  Food allergy:  avoiding red meat and thinks it's better. No recent tick bites.   Medication allergy: no Hymenoptera allergy: no Eczema: on the legs.  History of recurrent infections suggestive of immunodeficency: no  Diagnostics: Skin Testing: Environmental allergy panel and select foods. Positive to cat, horse and one mold. Negative to select foods.  Results discussed with patient/family.  Pediatric Percutaneous Testing - 09/10/22 1122     Time Antigen Placed 1122    Allergen Manufacturer Lavella Hammock    Location Back    Number of Test 31    Pediatric Panel Foods    1. Control-buffer 50% Glycerol Negative    2. Control-Histamine'1mg'$ /ml --   +/-   3. Peanut Negative    4. Soy bean food Negative    5. Wheat, whole Negative    6. Sesame Negative    7. Milk, cow Negative    8. Egg white, chicken  Negative    9. Casein Negative    10. Cashew Negative    11. Pecan  Negative    12. Holden Negative    13. Shellfish Negative    14. Shrimp Negative    15. Fish Mix Negative    16. Flounder Negative    17. Pork Negative    18. [place] Meat Negative    19. Beef Negative    20. Lamb Negative    21. Chicken Meat Negative    22. Rice Negative    23. White Potato Negative    24. Tomato Negative    25. Orange Negative    26. Banana Negative    27. Apple Negative    28. Peach Negative    29. Potato, Sweet Negative    30. Pea, Green/English Negative    31. Corn  Negative             Airborne Adult Perc - 09/10/22 1122     Time Antigen Placed 1122    Allergen Manufacturer Lavella Hammock    Location Back    Number of Test 59    1. Control-Buffer 50% Glycerol Negative    2. Control-Histamine 1 mg/ml --   +/-   3. Albumin saline Negative    4. Alderson Negative    5. [place] Negative    6. Johnson Negative    7. Clifton Blue Negative    8. Meadow Fescue Negative    9. Perennial Rye Negative    10. Sweet Vernal Negative    11. Timothy Negative    12. Cocklebur Negative    13.  Burweed Marshelder Negative    14. Ragweed, short Negative    15. Ragweed, Giant Negative    16. Plantain,  English Negative    17. Lamb's Quarters Negative    18. Sheep Sorrell Negative    19. Rough Pigweed Negative    20. Marsh Elder, Rough Negative    21. Mugwort, Common Negative    22. Ash mix Negative    23. Birch mix Negative    24. Beech American Negative    25. Box, Elder Negative    26. Cedar, red Negative    27. Cottonwood, [place] Negative    28. Elm mix Negative    29. Hickory Negative    30. Maple mix Negative    31. Oak, [place] mix Negative    32. Pecan Pollen Negative    33. Pine mix Negative    34. Sycamore Eastern Negative    35. Layton, Black Pollen Negative    36. Alternaria alternata 2+    37. Cladosporium Herbarum Negative    38. Aspergillus mix Negative    39. Penicillium mix Negative    40. Bipolaris sorokiniana (Helminthosporium) Negative    41. Drechslera spicifera (Curvularia) Negative    42. Mucor plumbeus Negative    43. Fusarium moniliforme Negative    44. Aureobasidium pullulans (pullulara) Negative    45. Rhizopus oryzae Negative    46. Botrytis cinera Negative    47. Epicoccum nigrum Negative    48. Phoma betae Negative    49. Candida Albicans Negative    50. Trichophyton mentagrophytes Negative    51. Mite, D Farinae  5,000 AU/ml Negative    52. Mite, D Pteronyssinus  5,000 AU/ml Negative    53. Cat Hair 10,000 BAU/ml 4+    54.  Dog Epithelia Negative    55. Mixed Feathers Negative    56. Horse Epithelia 4+  57. Cockroach, German Negative    58. Mouse Negative    59. 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   HYPERTENSION 12/20/2008   LUNG NODULE 12/20/2008    Past Medical History:  Diagnosis Date   HSV (herpes simplex virus) infection    Hyperlipidemia    Hypertension    Urticaria    Past Surgical History: Past Surgical History:  Procedure Laterality Date   ANKLE SURGERY  02/14/2008   Right Leg/Right Ankle   APPENDECTOMY     EAR CYST EXCISION N/A 04/17/2013   Procedure: CYST REMOVAL ON SCALP X2, BACK;  Surgeon: Harl Bowie, MD;  Location: [facility];  Service: General;  Laterality: N/A;  head and back   KNEE SURGERY Left 07/02/2022   Medication List:  Current Outpatient Medications  Medication Sig Dispense Refill   albuterol (VENTOLIN HFA) 108 (90 Base) MCG/ACT inhaler Inhale 1-2 puffs into the lungs every 6 (six) hours as needed for wheezing or shortness of breath. 6.7 g 0   amLODipine (NORVASC) 10 MG tablet Take 10 mg by mouth at bedtime.     Bacitracin-Polymyxin B (NEOSPORIN EX) Apply 1 application. topically daily. Left lower leg     famotidine (PEPCID) 20 MG tablet Take 1 tablet (20 mg total) by mouth 2 (two) times daily. 60 tablet 2   fexofenadine (ALLEGRA ALLERGY) 180 MG tablet Take 1 tablet (180 mg total) by mouth in the morning and at bedtime. 60 tablet 3   hydrocortisone cream 1 % Apply 1 application. topically daily. Left lower leg     hydrOXYzine (ATARAX) 10 MG tablet Take 1-2 tablets every 8 hours as needed for breakthrough rash/itching. 60 tablet 1   meloxicam (MOBIC) 15 MG tablet Take 1 tablet by mouth once daily 30 tablet 0   mupirocin ointment (BACTROBAN) 2 % Apply 1 application. topically in the morning and at bedtime. Left lower leg     valACYclovir (VALTREX) 1000 MG tablet Take 1,000 mg by mouth daily. Continuously     valsartan (DIOVAN) 80 MG tablet Take 80 mg by mouth daily.     No current facility-administered medications for this visit.   Allergies: No Known Allergies Social History: Social History   Socioeconomic History   Marital status: Single    Spouse name: n/a   Number of children: 0   Years  of education: College   Highest education level: Not on file  Occupational History   Occupation: Quarry manager: [employer]    Comment: [employer]  Tobacco Use   Smoking status: Every Day    Packs/day: 0.50    Years: 12.00    Total pack years: 6.00    Types: Cigarettes   Smokeless tobacco: Never   Tobacco comments:    Pt states he is smoking about 3 ciggs per day now. ALS 08/12/22  Substance and Sexual Activity   Alcohol use: Yes    Comment: Twice/week.   Drug use: No   Sexual activity: Never    Birth control/protection: Abstinence  Other Topics Concern   Not on file  Social History Narrative   Lives alone.   Social Determinants of Health  Financial Resource Strain: Not on file  Food Insecurity: Not on file  Transportation Needs: Not on file  Physical Activity: Not on file  Stress: Not on file  Social Connections: Not on file   Lives in a house. Smoking: 1/2 ppd x 20+ years Occupation: Production designer, theatre/television/film History: Environmental education officer in the house:  not sure [place] in the family room: no Carpet in the bedroom: no Heating: gas Cooling: central Pet: no  Family History: Family History  Problem Relation Age of Onset   Allergic rhinitis Mother    Review of Systems  Constitutional:  Negative for appetite change, chills, fever and unexpected weight change.  HENT:  Negative for congestion and rhinorrhea.   Eyes:  Negative for itching.  Respiratory:  Positive for shortness of breath and wheezing. Negative for cough and chest tightness.   Cardiovascular:  Negative for chest pain.  Gastrointestinal:  Negative for abdominal pain.  Genitourinary:  Negative for difficulty urinating.  Skin:  Positive for rash.  Allergic/Immunologic: Positive for environmental allergies.  Neurological:  Negative for headaches.    Objective: BP 120/84   Pulse 78   Temp 98.2 F (36.8 C)   Resp 18   Ht '5\' 11"'$  (1.803 m)   Wt 249 lb 6.4 oz  (113.1 kg)   SpO2 99%   BMI 34.78 kg/m  Body mass index is 34.78 kg/m. Physical Exam Vitals and nursing note reviewed.  Constitutional:      Appearance: Normal appearance. He is well-developed.  HENT:     Head: Normocephalic and atraumatic.     Right Ear: Tympanic membrane and external ear normal.     Left Ear: Tympanic membrane and external ear normal.     Nose: Congestion (on left side) present.     Mouth/Throat:     Mouth: Mucous membranes are moist.     Pharynx: Oropharynx is clear.  Eyes:     Conjunctiva/sclera: Conjunctivae normal.  Cardiovascular:     Rate and Rhythm: Normal rate and regular rhythm.     Heart sounds: Normal heart sounds. No murmur heard.    No friction rub. No gallop.  Pulmonary:     Effort: Pulmonary effort is normal.     Breath sounds: Normal breath sounds. No wheezing, rhonchi or rales.  Musculoskeletal:     Cervical back: Neck supple.  Skin:    General: Skin is warm.     Findings: No rash.  Neurological:     Mental Status: He is alert and oriented to person, place, and time.  Psychiatric:        Behavior: Behavior normal.   The plan was reviewed with the patient/family, and all questions/concerned were addressed.  It was my pleasure to see Minard today and participate in his care. Please feel free to contact me with any questions or concerns.  Sincerely,  Rexene Alberts, DO Allergy & Immunology  [facility] office: [place] office: [phone]

## 2022-09-10 ENCOUNTER — Other Ambulatory Visit: Payer: Self-pay

## 2022-09-10 ENCOUNTER — Ambulatory Visit: Payer: BC Managed Care – PPO | Admitting: Allergy

## 2022-09-10 ENCOUNTER — Encounter: Payer: Self-pay | Admitting: Allergy

## 2022-09-10 VITALS — BP 120/84 | HR 78 | Temp 98.2°F | Resp 18 | Ht 71.0 in | Wt 249.4 lb

## 2022-09-10 DIAGNOSIS — R0602 Shortness of breath: Secondary | ICD-10-CM | POA: Insufficient documentation

## 2022-09-10 DIAGNOSIS — L509 Urticaria, unspecified: Secondary | ICD-10-CM

## 2022-09-10 DIAGNOSIS — L501 Idiopathic urticaria: Secondary | ICD-10-CM | POA: Insufficient documentation

## 2022-09-10 DIAGNOSIS — J3089 Other allergic rhinitis: Secondary | ICD-10-CM | POA: Insufficient documentation

## 2022-09-10 LAB — LIPID PANEL WITH LDL/HDL RATIO
Cholesterol, Total: 182 mg/dL (ref 100–199)
HDL: 62 mg/dL (ref >39)
LDL Chol Calc (NIH): 102 mg/dL — ABNORMAL HIGH (ref 0–99)
LDL/HDL Ratio: 1.6 ratio (ref 0.0–3.6)
Triglycerides: 102 mg/dL (ref 0–149)
VLDL Cholesterol Cal: 18 mg/dL (ref 5–40)

## 2022-09-10 LAB — ANA+ENA+DNA/DS+SCL 70+SJOSSA/B
ANA Titer 1: NEGATIVE
ENA RNP Ab: <0.2 AI (ref 0.0–0.9)
ENA SM Ab Ser-aCnc: <0.2 AI (ref 0.0–0.9)
ENA SSA (RO) Ab: <0.2 AI (ref 0.0–0.9)
ENA SSB (LA) Ab: <0.2 AI (ref 0.0–0.9)
Scleroderma (Scl-70) (ENA) Antibody, IgG: <0.2 AI (ref 0.0–0.9)
dsDNA Ab: <1 IU/mL (ref 0–9)

## 2022-09-10 LAB — TSH+T4F+T3FREE
Free T4: 1.34 ng/dL (ref 0.82–1.77)
T3, Free: 2.8 pg/mL (ref 2.0–4.4)
TSH: 0.573 u[IU]/mL (ref 0.450–4.500)

## 2022-09-10 LAB — HIGH SENSITIVITY CRP: CRP, High Sensitivity: 0.88 mg/L (ref 0.00–3.00)

## 2022-09-10 LAB — CBC
Hematocrit: 44.3 % (ref 37.5–51.0)
Hemoglobin: 14.9 g/dL (ref 13.0–17.7)
MCH: 28.7 pg (ref 26.6–33.0)
MCHC: 33.6 g/dL (ref 31.5–35.7)
MCV: 85 fL (ref 79–97)
Platelets: 244 10*3/uL (ref 150–450)
RBC: 5.19 x10E6/uL (ref 4.14–5.80)
RDW: 13.4 % (ref 11.6–15.4)
WBC: 9.1 10*3/uL (ref 3.4–10.8)

## 2022-09-10 LAB — IRON,TIBC AND FERRITIN PANEL
Ferritin: 186 ng/mL (ref 30–400)
Iron Saturation: 33 % (ref 15–55)
Iron: 99 ug/dL (ref 38–169)
Total Iron Binding Capacity: 299 ug/dL (ref 250–450)
UIBC: 200 ug/dL (ref 111–343)

## 2022-09-10 LAB — HEPATIC FUNCTION PANEL
ALT: 16 IU/L (ref 0–44)
AST: 12 IU/L (ref 0–40)
Albumin: 4.4 g/dL (ref 3.8–4.9)
Alkaline Phosphatase: 74 IU/L (ref 44–121)
Bilirubin Total: 0.4 mg/dL (ref 0.0–1.2)
Bilirubin, Direct: 0.14 mg/dL (ref 0.00–0.40)
Total Protein: 6.9 g/dL (ref 6.0–8.5)

## 2022-09-10 LAB — ALDOSTERONE + RENIN ACTIVITY W/ RATIO
Aldos/Renin Ratio: 22.3 (ref 0.0–30.0)
Aldosterone: 10.9 ng/dL (ref 0.0–30.0)
Renin Activity, Plasma: 0.489 ng/mL/hr (ref 0.167–5.380)

## 2022-09-10 LAB — VITAMIN D 25 HYDROXY (VIT D DEFICIENCY, FRACTURES): Vit D, 25-Hydroxy: 11 ng/mL — ABNORMAL LOW (ref 30.0–100.0)

## 2022-09-10 LAB — SEDIMENTATION RATE: Sed Rate: 19 mm/hr (ref 0–30)

## 2022-09-10 LAB — KAPPA/LAMBDA LIGHT CHAINS, FREE, WITH RATIO, 24HR. URINE
FR KAPPA LT CH,24HR: 8.34 mg/24 hr
FR LAMBDA LT CH,24HR: <1.04 mg/24 hr
Free Kappa Lt Chains,Ur: 5.56 mg/L (ref 1.17–86.46)
Free Lambda Lt Chains,Ur: <0.69 mg/L (ref 0.27–15.21)
Kappa/Lambda Ratio,U: >8.06 (ref 1.83–14.26)

## 2022-09-10 LAB — B12 AND FOLATE PANEL
Folate: 15.8 ng/mL (ref >3.0)
Vitamin B-12: 413 pg/mL (ref 232–1245)

## 2022-09-10 LAB — HGB A1C W/O EAG: Hgb A1c MFr Bld: 6 % — ABNORMAL HIGH (ref 4.8–5.6)

## 2022-09-10 MED ORDER — FAMOTIDINE 20 MG PO TABS: 20.0000 mg | ORAL_TABLET | Freq: Two times a day (BID) | ORAL | 2 refills | Status: DC

## 2022-09-10 MED ORDER — FEXOFENADINE HCL 180 MG PO TABS: 180.0000 mg | ORAL_TABLET | Freq: Two times a day (BID) | ORAL | 3 refills | Status: DC

## 2022-09-10 MED ORDER — HYDROXYZINE HCL 10 MG PO TABS: ORAL_TABLET | ORAL | 1 refills | Status: DC

## 2022-09-10 NOTE — Assessment & Plan Note (Addendum)
Mild symptoms and spring.  Noticed allergic symptoms around cats and horses in the past. Today's skin prick testing was positive to cat, horse and one mold. See below for environmental control measures. The above antihistamine should also help with these symptoms. Discussed not using nasal decongestant sprays on a daily basis it can cause rebound nasal congestion.

## 2022-09-10 NOTE — Assessment & Plan Note (Signed)
Being worked up by [employer] and cards. Follow up with them as scheduled.

## 2022-09-10 NOTE — Assessment & Plan Note (Signed)
Daily hive outbreaks x 2 months. Slowly improving but it's making him anxious and stressed. Apparently had was diagnosed with bronchitis and was treated with prednisone, hycodan and albuterol right before onset. Did not check for flu/Covid-19. Now also has some episodes of shortness of breath. Follows with pulm and cards. Tried antihistamines with minimal benefit. Only prednisone helps. Denies recent tick bites. No pets at home. Today's skin prick testing was positive to cat, horse and one mold. Negative to select foods.  I don't think the above allergens are causing the rash/hives. 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 Discussed with patient, that urticaria is usually caused by release of histamine by cutaneous mast cells but sometimes it is non-histamine mediated. Explained that urticaria is not always associated with allergies. In most cases, the exact etiology for urticaria can not be established and it is considered idiopathic. Start allegra (fexofenadine) '180mg'$  twice a day. If symptoms are not controlled or causes drowsiness let us know. Start Pepcid (famotidine) '20mg'$  twice a day.  May take hydroxyzine '10mg'$  to '20mg'$  every 8 hours as needed for breakthrough rash/itching. Avoid the following potential triggers: alcohol, tight clothing, NSAIDs, hot showers and getting overheated. Get bloodwork to rule out other etiologies.

## 2022-09-10 NOTE — Assessment & Plan Note (Signed)
>>  ASSESSMENT AND PLAN FOR URTICARIA WRITTEN ON 09/10/2022 12:48 PM BY Garnet Sierras, DO  Daily hive outbreaks x 2 months. Slowly improving but it's making him anxious and stressed. Apparently had was diagnosed with bronchitis and was treated with prednisone, hycodan and albuterol right before onset. Did not check for flu/Covid-19. Now also has some episodes of shortness of breath. Follows with pulm and cards. Tried antihistamines with minimal benefit. Only prednisone helps. Denies recent tick bites. No pets at home. Today's skin prick testing was positive to cat, horse and one mold. Negative to select foods.  I don't think the above allergens are causing the rash/hives. 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 Discussed with patient, that urticaria is usually caused by release of histamine by cutaneous mast cells but sometimes it is non-histamine mediated. Explained that urticaria is not always associated with allergies. In most cases, the exact etiology for urticaria can not be established and it is considered idiopathic. Start allegra (fexofenadine) '180mg'$  twice a day. If symptoms are not controlled or causes drowsiness let us know. Start Pepcid (famotidine) '20mg'$  twice a day.  May take hydroxyzine '10mg'$  to '20mg'$  every 8 hours as needed for breakthrough rash/itching. Avoid the following potential triggers: alcohol, tight clothing, NSAIDs, hot showers and getting overheated. Get bloodwork to rule out other etiologies.

## 2022-09-10 NOTE — Patient Instructions (Addendum)
Today's skin testing was positive to cat, horse and one mold. Negative to select foods.   Hives: I don't think the above allergens are causing your rash/hives.  Start allegra (fexofenadine) '180mg'$  twice a day. If symptoms are not controlled or causes drowsiness let us know. Start pepcid (famotidine) '20mg'$  twice a day.  May take hydroxyzine '10mg'$  to '20mg'$  every 8 hours as needed for breakthrough rash/itching. Avoid the following potential triggers: alcohol, tight clothing, NSAIDs, hot showers and getting overheated. Get bloodwork:  We are ordering labs, so please allow 1-2 weeks for the results to come back. With the newly implemented Cures Act, the labs might be visible to you at the same time that they become visible to me. However, I will not address the results until all of the results are back, so please be patient.    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 Discussed with patient, that urticaria is usually caused by release of histamine by cutaneous mast cells but sometimes it is non-histamine mediated. Explained that urticaria is not always associated with allergies. In most cases, the exact etiology for urticaria can not be established and it is considered idiopathic.  Follow up in 1 months or sooner if needed.   Skin care recommendations  Bath time: Always use lukewarm water. AVOID very hot or cold water. Keep bathing time to 5-10 minutes. Do NOT use bubble bath. Use a mild soap and use just enough to wash the dirty areas. Do NOT scrub skin vigorously.  After bathing, pat dry your skin with a towel. Do NOT rub or scrub the skin.  Moisturizers and prescriptions:  ALWAYS apply moisturizers immediately after bathing (within 3 minutes). This helps to lock-in moisture. Use the moisturizer several times a day over the whole body. Good summer moisturizers include: Aveeno, CeraVe, Cetaphil. Good winter moisturizers include: Aquaphor, Vaseline, Cerave,  Cetaphil, Eucerin, Vanicream. When using moisturizers along with medications, the moisturizer should be applied about one hour after applying the medication to prevent diluting effect of the medication or moisturize around where you applied the medications. When not using medications, the moisturizer can be continued twice daily as maintenance.  Laundry and clothing: Avoid laundry products with added color or perfumes. Use unscented hypo-allergenic laundry products such as Tide free, Cheer free & gentle, and All free and clear.  If the skin still seems dry or sensitive, you can try double-rinsing the clothes. Avoid tight or scratchy clothing such as wool. Do not use fabric softeners or dyer sheets.    Pet Allergen Avoidance: Contrary to popular opinion, there are no "hypoallergenic" breeds of dogs or cats. That is because people are not allergic to an animal's hair, but to an allergen found in the animal's saliva, dander (dead skin flakes) or urine. Pet allergy symptoms typically occur within minutes. For some people, symptoms can build up and become most severe 8 to 12 hours after contact with the animal. People with severe allergies can experience reactions in public places if dander has been transported on the pet owners' clothing. Keeping an animal outdoors is only a partial solution, since homes with pets in the yard still have higher concentrations of animal allergens. Before getting a pet, ask your allergist to determine if you are allergic to animals. If your pet is already considered part of your family, try to minimize contact and keep the pet out of the bedroom and other rooms where you spend a great deal of time. As with dust mites, vacuum  carpets often or replace carpet with a hardwood floor, tile or linoleum. High-efficiency particulate air (HEPA) cleaners can reduce allergen levels over time. While dander and saliva are the source of cat and dog allergens, urine is the source of  allergens from rabbits, hamsters, mice and [place] pigs; so ask a non-allergic family member to clean the animal's cage. If you have a pet allergy, talk to your allergist about the potential for allergy immunotherapy (allergy shots). This strategy can often provide long-term relief. Mold Control Mold and fungi can grow on a variety of surfaces provided certain temperature and moisture conditions exist.  Outdoor molds grow on plants, decaying vegetation and soil. The major outdoor mold, Alternaria and Cladosporium, are found in very high numbers during hot and dry conditions. Generally, a late summer - fall peak is seen for common outdoor fungal spores. Rain will temporarily lower outdoor mold spore count, but counts rise rapidly when the rainy period ends. The most important indoor molds are Aspergillus and Penicillium. Dark, humid and poorly ventilated basements are ideal sites for mold growth. The next most common sites of mold growth are the bathroom and the kitchen. Outdoor (Seasonal) Mold Control Use air conditioning and keep windows closed. Avoid exposure to decaying vegetation. Avoid leaf raking. Avoid grain handling. Consider wearing a face mask if working in moldy areas.  Indoor (Perennial) Mold Control  Maintain humidity below 50%. Get rid of mold growth on hard surfaces with water, detergent and, if necessary, 5% bleach (do not mix with other cleaners). Then dry the area completely. If mold covers an area more than 10 square feet, consider hiring an indoor environmental professional. For clothing, washing with soap and water is best. If moldy items cannot be cleaned and dried, throw them away. Remove sources e.g. contaminated carpets. Repair and seal leaking roofs or pipes. Using dehumidifiers in damp basements may be helpful, but empty the water and clean units regularly to prevent mildew from forming. All rooms, especially basements, bathrooms and kitchens, require ventilation and  cleaning to deter mold and mildew growth. Avoid carpeting on concrete or damp floors, and storing items in damp areas.

## 2022-09-11 ENCOUNTER — Other Ambulatory Visit: Payer: Self-pay | Admitting: Internal Medicine

## 2022-09-11 LAB — 5 HIAA, QUANTITATIVE, URINE, 24 HOUR
5-HIAA, Ur: 1.6 mg/L
5-HIAA,Quant.,24 Hr Urine: 2.4 mg/24 hr (ref 0.0–14.9)

## 2022-09-11 LAB — CATECHOLAMINE+VMA, 24-HR URINE
Dopamine , 24H Ur: 323 ug/24 hr (ref 0–510)
Dopamine, Rand Ur: 215 ug/L
Epinephrine, 24H Ur: 17 ug/24 hr (ref 0–20)
Epinephrine, Rand Ur: 11 ug/L
Norepinephrine, 24H Ur: 75 ug/24 hr (ref 0–135)
Norepinephrine, Rand Ur: 50 ug/L
VMA, 24H Ur Adult: 4.5 mg/24 hr (ref 0.0–7.5)
VMA, Urine: 3 mg/L

## 2022-09-11 MED ORDER — VITAMIN D (ERGOCALCIFEROL) 1.25 MG (50000 UNIT) PO CAPS: 50000.0000 [IU] | ORAL_CAPSULE | ORAL | 6 refills | Status: DC

## 2022-09-11 NOTE — Progress Notes (Signed)
Called and spoke with the patient, other than diet change he wants to know what he needs to do about the cholesterol being high

## 2022-09-11 NOTE — Progress Notes (Signed)
Spoke with patient, he acknowledged understanding. He has no further questions.

## 2022-09-16 ENCOUNTER — Ambulatory Visit: Payer: BC Managed Care – PPO

## 2022-09-16 ENCOUNTER — Ambulatory Visit (INDEPENDENT_AMBULATORY_CARE_PROVIDER_SITE_OTHER): Payer: BC Managed Care – PPO | Admitting: Pulmonary Disease

## 2022-09-16 DIAGNOSIS — I10 Essential (primary) hypertension: Secondary | ICD-10-CM

## 2022-09-16 DIAGNOSIS — R0609 Other forms of dyspnea: Secondary | ICD-10-CM

## 2022-09-16 DIAGNOSIS — I493 Ventricular premature depolarization: Secondary | ICD-10-CM

## 2022-09-16 LAB — PULMONARY FUNCTION TEST
DL/VA % pred: 127 %
DL/VA: 5.54 ml/min/mmHg/L
DLCO cor % pred: 89 %
DLCO cor: 27.71 ml/min/mmHg
DLCO unc % pred: 89 %
DLCO unc: 27.71 ml/min/mmHg
FEF 25-75 Post: 2.88 L/sec
FEF 25-75 Pre: 2.42 L/sec
FEF2575-%Change-Post: 18 %
FEF2575-%Pred-Post: 80 %
FEF2575-%Pred-Pre: 67 %
FEV1-%Change-Post: 1 %
FEV1-%Pred-Post: 66 %
FEV1-%Pred-Pre: 66 %
FEV1-Post: 2.76 L
FEV1-Pre: 2.73 L
FEV1FVC-%Change-Post: 4 %
FEV1FVC-%Pred-Pre: 103 %
FEV6-%Change-Post: -3 %
FEV6-%Pred-Post: 63 %
FEV6-%Pred-Pre: 65 %
FEV6-Post: 3.28 L
FEV6-Pre: 3.38 L
FEV6FVC-%Change-Post: 0 %
FEV6FVC-%Pred-Post: 103 %
FEV6FVC-%Pred-Pre: 102 %
FVC-%Change-Post: -3 %
FVC-%Pred-Post: 61 %
FVC-%Pred-Pre: 63 %
FVC-Post: 3.29 L
FVC-Pre: 3.41 L
Post FEV1/FVC ratio: 84 %
Post FEV6/FVC ratio: 100 %
Pre FEV1/FVC ratio: 80 %
Pre FEV6/FVC Ratio: 99 %
RV % pred: 98 %
RV: 2.14 L
TLC % pred: 74 %
TLC: 5.52 L

## 2022-09-16 NOTE — Progress Notes (Signed)
PFT done today. 

## 2022-09-17 ENCOUNTER — Encounter: Payer: Self-pay | Admitting: Pulmonary Disease

## 2022-09-17 ENCOUNTER — Ambulatory Visit: Payer: BC Managed Care – PPO | Admitting: Pulmonary Disease

## 2022-09-17 VITALS — BP 120/80 | HR 96 | Temp 98.2°F | Ht 72.0 in | Wt 256.0 lb

## 2022-09-17 DIAGNOSIS — J3089 Other allergic rhinitis: Secondary | ICD-10-CM | POA: Diagnosis not present

## 2022-09-17 MED ORDER — AZELASTINE HCL 0.1 % NA SOLN: 2.0000 | Freq: Two times a day (BID) | NASAL | 12 refills | Status: DC

## 2022-09-17 NOTE — Patient Instructions (Addendum)
Nice to see you again  Breathing tests look ok which is great  Reviewed, continue antihistamine and use azelastine nasal spray as prescribed, 2 sprays each nostril twice a day, if things are improved in a week or 2 decrease to once daily  Lets meet in 4 months or sooner as needed after sleep study

## 2022-09-18 LAB — COMPREHENSIVE METABOLIC PANEL
ALT: 11 IU/L (ref 0–44)
AST: 10 IU/L (ref 0–40)
Albumin/Globulin Ratio: 2 (ref 1.2–2.2)
Albumin: 4.5 g/dL (ref 3.8–4.9)
Alkaline Phosphatase: 75 IU/L (ref 44–121)
BUN/Creatinine Ratio: 16 (ref 9–20)
BUN: 13 mg/dL (ref 6–24)
Bilirubin Total: 0.4 mg/dL (ref 0.0–1.2)
CO2: 24 mmol/L (ref 20–29)
Calcium: 9.6 mg/dL (ref 8.7–10.2)
Chloride: 102 mmol/L (ref 96–106)
Creatinine, Ser: 0.8 mg/dL (ref 0.76–1.27)
Globulin, Total: 2.3 g/dL (ref 1.5–4.5)
Glucose: 103 mg/dL — ABNORMAL HIGH (ref 70–99)
Potassium: 4.5 mmol/L (ref 3.5–5.2)
Sodium: 141 mmol/L (ref 134–144)
Total Protein: 6.8 g/dL (ref 6.0–8.5)
eGFR: 106 mL/min/{1.73_m2} (ref >59)

## 2022-09-18 LAB — CBC WITH DIFFERENTIAL/PLATELET
Basophils Absolute: 0 10*3/uL (ref 0.0–0.2)
Basos: 0 %
EOS (ABSOLUTE): 0.3 10*3/uL (ref 0.0–0.4)
Eos: 5 %
Hematocrit: 43.9 % (ref 37.5–51.0)
Hemoglobin: 14.7 g/dL (ref 13.0–17.7)
Immature Grans (Abs): 0 10*3/uL (ref 0.0–0.1)
Immature Granulocytes: 0 %
Lymphocytes Absolute: 2.5 10*3/uL (ref 0.7–3.1)
Lymphs: 40 %
MCH: 28.8 pg (ref 26.6–33.0)
MCHC: 33.5 g/dL (ref 31.5–35.7)
MCV: 86 fL (ref 79–97)
Monocytes Absolute: 0.7 10*3/uL (ref 0.1–0.9)
Monocytes: 11 %
Neutrophils Absolute: 2.8 10*3/uL (ref 1.4–7.0)
Neutrophils: 44 %
Platelets: 284 10*3/uL (ref 150–450)
RBC: 5.1 x10E6/uL (ref 4.14–5.80)
RDW: 13.3 % (ref 11.6–15.4)
WBC: 6.4 10*3/uL (ref 3.4–10.8)

## 2022-09-18 LAB — C3 AND C4
Complement C3, Serum: 129 mg/dL (ref 82–167)
Complement C4, Serum: 28 mg/dL (ref 12–38)

## 2022-09-18 LAB — CHRONIC URTICARIA: cu index: <1.9 (ref <10)

## 2022-09-18 LAB — THYROID CASCADE PROFILE: TSH: 0.563 u[IU]/mL (ref 0.450–4.500)

## 2022-09-18 LAB — TRYPTASE: Tryptase: 7.7 ug/L (ref 2.2–13.2)

## 2022-09-18 LAB — C-REACTIVE PROTEIN: CRP: 3 mg/L (ref 0–10)

## 2022-09-18 LAB — ALPHA-GAL PANEL
Allergen Lamb IgE: <0.1 kU/L
Beef IgE: <0.1 kU/L
IgE (Immunoglobulin E), Serum: 596 IU/mL — ABNORMAL HIGH (ref 6–495)
O215-IgE Alpha-Gal: <0.1 kU/L
Pork IgE: <0.1 kU/L

## 2022-09-18 LAB — ANA W/REFLEX: Anti Nuclear Antibody (ANA): NEGATIVE

## 2022-09-18 LAB — SEDIMENTATION RATE: Sed Rate: 6 mm/hr (ref 0–30)

## 2022-09-21 NOTE — Progress Notes (Signed)
$'@Patient'Martin [name]$  ID: VRAJ DENARDO, male    DOB: 10/18/1970, 52 y.o.   MRN: [id number]  Chief Complaint  Patient presents with   Follow-up    Pft results     Referring provider: [name], MD  HPI:   52 y.o. man whom we are seeing in evaluation of shortness of breath.  Previous history of lung nodules.  Most recent allergy note reviewed.  Most recent cardiology note reviewed.  Patient returns for routine follow-up.  At last visit, high suspicion of asthma based on description of symptoms.  Likely, contribution of anxiety making symptoms worse.  He was seen by allergy recently for his recurrent hives.  Taking medications.  Seems like mainly nasal congestion.  PFTs were performed recently.  He prefer to do this prior to any medication.  Overall feels like his breathing is doing okay.  PFTs normal, full interpretation below.  HPI initial visit: Patient reports issues with breathing for the last few months.  Particular in the evenings.  Sudden onset of chest tightness difficulty breathing.  Duration varies.  Sometimes a few seconds to a couple minutes.  Sometimes gets better on its own.  Usually with time resolved.  He has been seen and given prednisone.  He does state that this helped relieve the symptoms for a period of days but then symptoms returned.  He feels like they cause panic attacks.  He feels part of the issue is he gets panicked and things worsen.  But he clearly describes some chest discomfort and improvement with steroids.  He also thinks albuterol helps.  Not every time but sometimes.  Chest x-ray 07/2022 personally reviewed interpret as clear lungs bilaterally.    Questionaires / Pulmonary Flowsheets:   ACT:  Asthma Control Test ACT Total Score  09/10/2022 10:00 AM 25    MMRC:     No data to display           Epworth:      No data to display           Tests:   FENO:  No results found for: "NITRICOXIDE"  PFT:    Latest Ref Rng & Units 09/16/2022     3:39 PM  PFT Results  FVC-Pre L 3.41  P  FVC-Predicted Pre % 63  P  FVC-Post L 3.29  P  FVC-Predicted Post % 61  P  Pre FEV1/FVC % % 80  P  Post FEV1/FCV % % 84  P  FEV1-Pre L 2.73  P  FEV1-Predicted Pre % 66  P  FEV1-Post L 2.76  P  DLCO uncorrected ml/min/mmHg 27.71  P  DLCO UNC% % 89  P  DLCO corrected ml/min/mmHg 27.71  P  DLCO COR %Predicted % 89  P  DLVA Predicted % 127  P  TLC L 5.52  P  TLC % Predicted % 74  P  RV % Predicted % 98  P    P Preliminary result   Personally reviewed interpreted spirometry suggestive of air trapping versus restriction.  Lung volumes within normal limits.  DLCO within normal limits.  WALK:      No data to display           Imaging: Personally reviewed and as per EMR discussion this note PCV ECHOCARDIOGRAM COMPLETE  Result Date: 09/16/2022 Echocardiogram 09/16/2022: Normal LV systolic function with visual EF 60-65%. Left ventricle cavity is dilated. Moderate left ventricular hypertrophy. Normal global wall motion. Normal diastolic filling pattern, normal LAP. Structurally normal  tricuspid valve with trace regurgitation. No evidence of tricuspid valve stenosis. No evidence of pulmonary hypertension. No prior study for comparison.   PCV CARDIAC STRESS TEST  Result Date: 09/10/2022 Exercise treadmill stress test 09/10/2022: Exercise treadmill stress test performed using Bruce protocol.  Patient reached 8.5 METS, and 92% of age predicted maximum heart rate.  Exercise capacity was good.  No chest pain reported.  Normal heart rate and hemodynamic response. Stress EKG revealed no ischemic changes. Low risk study.    Lab Results: Personally reviewed CBC    Component Value Date/Time   WBC 6.4 09/11/2022 0853   WBC 7.5 12/06/2021 2127   RBC 5.10 09/11/2022 0853   RBC 4.92 12/06/2021 2127   HGB 14.7 09/11/2022 0853   HCT 43.9 09/11/2022 0853   PLT 284 09/11/2022 0853   MCV 86 09/11/2022 0853   MCH 28.8 09/11/2022 0853   MCH 29.5 12/06/2021  2127   MCHC 33.5 09/11/2022 0853   MCHC 33.9 12/06/2021 2127   RDW 13.3 09/11/2022 0853   LYMPHSABS 2.5 09/11/2022 0853   EOSABS 0.3 09/11/2022 0853   BASOSABS 0.0 09/11/2022 0853    BMET    Component Value Date/Time   NA 141 09/11/2022 0853   K 4.5 09/11/2022 0853   CL 102 09/11/2022 0853   CO2 24 09/11/2022 0853   GLUCOSE 103 (H) 09/11/2022 0853   GLUCOSE 88 12/06/2021 2127   BUN 13 09/11/2022 0853   CREATININE 0.80 09/11/2022 0853   CREATININE 0.83 02/21/2016 1451   CALCIUM 9.6 09/11/2022 0853   GFRNONAA >60 12/06/2021 2127   GFRNONAA >89 02/21/2016 1451   GFRAA >89 02/21/2016 1451    BNP    Component Value Date/Time   BNP 5.9 12/06/2021 2127    ProBNP No results found for: "PROBNP"  Specialty Problems       Pulmonary Problems   LUNG NODULE    Qualifier: Diagnosis of  By: [name] MD, [place] D Patient states he will make appt with Dr. [name]      Other allergic rhinitis   Shortness of breath    No Known Allergies  Immunization History  Administered Date(s) Administered   Hepatitis B, adult 05/21/2014, 06/21/2014, 11/20/2014   Influenza,inj,Quad PF,6+ Mos 05/21/2014    Past Medical History:  Diagnosis Date   HSV (herpes simplex virus) infection    Hyperlipidemia    Hypertension    Urticaria     Tobacco History: Social History   Tobacco Use  Smoking Status Every Day   Packs/day: 0.50   Years: 12.00   Total pack years: 6.00   Types: Cigarettes  Smokeless Tobacco Never  Tobacco Comments   Pt states he is smoking about 3 ciggs per day now. ALS 08/12/22   Ready to quit: Not Answered Counseling given: Not Answered Tobacco comments: Pt states he is smoking about 3 ciggs per day now. ALS 08/12/22   Continue to not smoke  Outpatient Encounter Medications as of 09/17/2022  Medication Sig   albuterol (VENTOLIN HFA) 108 (90 Base) MCG/ACT inhaler Inhale 1-2 puffs into the lungs every 6 (six) hours as needed for wheezing or shortness of breath.    amLODipine (NORVASC) 10 MG tablet Take 10 mg by mouth at bedtime.   azelastine (ASTELIN) 0.1 % nasal spray Place 2 sprays into both nostrils 2 (two) times daily. Use in each nostril as directed   Bacitracin-Polymyxin B (NEOSPORIN EX) Apply 1 application. topically daily. Left lower leg   famotidine (PEPCID) 20 MG tablet Take  1 tablet (20 mg total) by mouth 2 (two) times daily.   fexofenadine (ALLEGRA ALLERGY) 180 MG tablet Take 1 tablet (180 mg total) by mouth in the morning and at bedtime.   hydrocortisone cream 1 % Apply 1 application. topically daily. Left lower leg   hydrOXYzine (ATARAX) 10 MG tablet Take 1-2 tablets every 8 hours as needed for breakthrough rash/itching.   meloxicam (MOBIC) 15 MG tablet Take 1 tablet by mouth once daily   mupirocin ointment (BACTROBAN) 2 % Apply 1 application. topically in the morning and at bedtime. Left lower leg   valACYclovir (VALTREX) 1000 MG tablet Take 1,000 mg by mouth daily. Continuously   valsartan (DIOVAN) 80 MG tablet Take 80 mg by mouth daily.   Vitamin D, Ergocalciferol, (DRISDOL) 1.25 MG (50000 UNIT) CAPS capsule Take 1 capsule (50,000 Units total) by mouth every 7 (seven) days.   No facility-administered encounter medications on file as of 09/17/2022.     Review of Systems  Review of Systems  N/a Physical Exam  BP 120/80   Pulse 96   Temp 98.2 F (36.8 C) (Oral)   Ht 6' (1.829 m)   Wt 256 lb (116.1 kg)   SpO2 100%   BMI 34.72 kg/m   Wt Readings from Last 5 Encounters:  09/17/22 256 lb (116.1 kg)  09/10/22 249 lb 6.4 oz (113.1 kg)  09/02/22 252 lb (114.3 kg)  08/12/22 255 lb 6.4 oz (115.8 kg)  07/28/22 250 lb (113.4 kg)    BMI Readings from Last 5 Encounters:  09/17/22 34.72 kg/m  09/10/22 34.78 kg/m  09/02/22 34.18 kg/m  08/12/22 34.64 kg/m  07/28/22 33.91 kg/m     Physical Exam General: Sitting in chair, no acute distress Eyes: EOMI, no icterus Neck: Supple, no JVP Pulmonary: Clear,  distant Cardiovascular warm, no edema MSK: No synovitis, no joint effusion Abdomen: Nondistended, bowel sounds present Neuro: Normal gait, no weakness Psych: Normal mood, full affect   Assessment & Plan:   DOE: Episodic.  Severe.  Prednisone with relief but then recurs.  Suspicious for asthma.  PFTs largely normal.  Recommend ICS/LABA therapy in the past.  He does like shortness of breath is stable to slightly improved.  After mold remediation.  As such, he does not wish to escalate medications at this time.  Sleep disordered breathing: Reports episodes of not breathing at night, waking up gasping for air.  Home sleep test to evaluate for OSA not yet performed.  Rhinorrhea: Recommend continue antihistamines as prescribed by allergy physician.  Recommend azelastine nasal spray 2 spray twice daily for a week then decrease to daily if symptoms improving.   Return in about 4 months (around 01/16/2023).   Lanier Clam, MD 09/21/2022

## 2022-10-07 NOTE — Progress Notes (Unsigned)
Follow Up Note  RE: MARRIO SCRIBNER MRN: [id number] DOB: 26-Dec-1970 Date of Office Visit: 10/08/2022  Referring provider: Iona Beard, MD Primary care provider: Iona Beard, MD  Chief Complaint: No chief complaint on file.  History of Present Illness: I had the pleasure of seeing Martin [name] for a follow up visit at the [facility]  on 10/07/2022. He is a 52 y.o. male, who is being followed for urticaria, allergic rhinitis and shortness of breath. His previous allergy office visit was on 09/10/2022 with Dr. Maudie Mercury. Today is a regular follow up visit.  I reviewed the bloodwork. Blood count, kidney function, liver function, electrolytes, thyroid, autoimmune screener, inflammation markers, chronic urticaria index (checks for autoantibodies that trigger mast cells), tryptase (checks for mast cell issues) and alpha gal (checks for red meat allergy) were all normal which is great.   Based on these results no trigger found for your hives. You most likely have chronic idiopathic urticaria.   Continue medications as discussed at last office visit:  Start allegra (fexofenadine) '180mg'$  twice a day. If symptoms are not controlled or causes drowsiness let us know.  Start Pepcid (famotidine) '20mg'$  twice a day.  May take hydroxyzine '10mg'$  to '20mg'$  every 8 hours as needed for breakthrough rash/itching.  Urticaria Daily hive outbreaks x 2 months. Slowly improving but it's making him anxious and stressed. Apparently had was diagnosed with bronchitis and was treated with prednisone, hycodan and albuterol right before onset. Did not check for flu/Covid-19. Now also has some episodes of shortness of breath. Follows with pulm and cards. Tried antihistamines with minimal benefit. Only prednisone helps. Denies recent tick bites. No pets at home. Today's skin prick testing was positive to cat, horse and one mold. Negative to select foods.  I don't think the above allergens are causing the  rash/hives. 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 Discussed with patient, that urticaria is usually caused by release of histamine by cutaneous mast cells but sometimes it is non-histamine mediated. Explained that urticaria is not always associated with allergies. In most cases, the exact etiology for urticaria can not be established and it is considered idiopathic. Start allegra (fexofenadine) '180mg'$  twice a day. If symptoms are not controlled or causes drowsiness let us know. Start Pepcid (famotidine) '20mg'$  twice a day.  May take hydroxyzine '10mg'$  to '20mg'$  every 8 hours as needed for breakthrough rash/itching. Avoid the following potential triggers: alcohol, tight clothing, NSAIDs, hot showers and getting overheated. Get bloodwork to rule out other etiologies.    Other allergic rhinitis Mild symptoms and spring.  Noticed allergic symptoms around cats and horses in the past. Today's skin prick testing was positive to cat, horse and one mold. See below for environmental control measures. The above antihistamine should also help with these symptoms. Discussed not using nasal decongestant sprays on a daily basis it can cause rebound nasal congestion.      Shortness of breath Being worked up by [employer] and cards. Follow up with them as scheduled.    Return in about 4 weeks (around 10/08/2022).  09/17/2022 pulm visit: "DOE: Episodic.  Severe.  Prednisone with relief but then recurs.  Suspicious for asthma.  PFTs largely normal.  Recommend ICS/LABA therapy in the past.  He does like shortness of breath is stable to slightly improved.  After mold remediation.  As such, he does not wish to escalate medications at this time.   Sleep disordered breathing: Reports episodes of not breathing  at night, waking up gasping for air.  Home sleep test to evaluate for OSA not yet performed.   Rhinorrhea: Recommend continue antihistamines as prescribed by allergy physician.  Recommend  azelastine nasal spray 2 spray twice daily for a week then decrease to daily if symptoms improving."  Assessment and Plan: Martin [name] is a 52 y.o. male with: No problem-specific Assessment & Plan notes found for this encounter.  No follow-ups on file.  No orders of the defined types were placed in this encounter.  Lab Orders  No laboratory test(s) ordered today    Diagnostics: Spirometry:  Tracings reviewed. His effort: {Blank single:19197::"Good reproducible efforts.","It was hard to get consistent efforts and there is a question as to whether this reflects a maximal maneuver.","Poor effort, data can not be interpreted."} FVC: ***L FEV1: ***L, ***% predicted FEV1/FVC ratio: ***% Interpretation: {Blank single:19197::"Spirometry consistent with mild obstructive disease","Spirometry consistent with moderate obstructive disease","Spirometry consistent with severe obstructive disease","Spirometry consistent with possible restrictive disease","Spirometry consistent with mixed obstructive and restrictive disease","Spirometry uninterpretable due to technique","Spirometry consistent with normal pattern","No overt abnormalities noted given today's efforts"}.  Please see scanned spirometry results for details.  Skin Testing: {Blank single:19197::"Select foods","Environmental allergy panel","Environmental allergy panel and select foods","Food allergy panel","None","Deferred due to recent antihistamines use"}. *** Results discussed with patient/family.   Medication List:  Current Outpatient Medications  Medication Sig Dispense Refill   albuterol (VENTOLIN HFA) 108 (90 Base) MCG/ACT inhaler Inhale 1-2 puffs into the lungs every 6 (six) hours as needed for wheezing or shortness of breath. 6.7 g 0   amLODipine (NORVASC) 10 MG tablet Take 10 mg by mouth at bedtime.     azelastine (ASTELIN) 0.1 % nasal spray Place 2 sprays into both nostrils 2 (two) times daily. Use in each nostril as directed 30 mL 12    Bacitracin-Polymyxin B (NEOSPORIN EX) Apply 1 application. topically daily. Left lower leg     famotidine (PEPCID) 20 MG tablet Take 1 tablet (20 mg total) by mouth 2 (two) times daily. 60 tablet 2   fexofenadine (ALLEGRA ALLERGY) 180 MG tablet Take 1 tablet (180 mg total) by mouth in the morning and at bedtime. 60 tablet 3   hydrocortisone cream 1 % Apply 1 application. topically daily. Left lower leg     hydrOXYzine (ATARAX) 10 MG tablet Take 1-2 tablets every 8 hours as needed for breakthrough rash/itching. 60 tablet 1   meloxicam (MOBIC) 15 MG tablet Take 1 tablet by mouth once daily 30 tablet 0   mupirocin ointment (BACTROBAN) 2 % Apply 1 application. topically in the morning and at bedtime. Left lower leg     valACYclovir (VALTREX) 1000 MG tablet Take 1,000 mg by mouth daily. Continuously     valsartan (DIOVAN) 80 MG tablet Take 80 mg by mouth daily.     Vitamin D, Ergocalciferol, (DRISDOL) 1.25 MG (50000 UNIT) CAPS capsule Take 1 capsule (50,000 Units total) by mouth every 7 (seven) days. 5 capsule 6   No current facility-administered medications for this visit.   Allergies: No Known Allergies I reviewed his past medical history, social history, family history, and environmental history and no significant changes have been reported from his previous visit.  Review of Systems  Constitutional:  Negative for appetite change, chills, fever and unexpected weight change.  HENT:  Negative for congestion and rhinorrhea.   Eyes:  Negative for itching.  Respiratory:  Positive for shortness of breath and wheezing. Negative for cough and chest tightness.   Cardiovascular:  Negative for chest  pain.  Gastrointestinal:  Negative for abdominal pain.  Genitourinary:  Negative for difficulty urinating.  Skin:  Positive for rash.  Allergic/Immunologic: Positive for environmental allergies.  Neurological:  Negative for headaches.    Objective: There were no vitals taken for this visit. There is no  height or weight on file to calculate BMI. Physical Exam Vitals and nursing note reviewed.  Constitutional:      Appearance: Normal appearance. He is well-developed.  HENT:     Head: Normocephalic and atraumatic.     Right Ear: Tympanic membrane and external ear normal.     Left Ear: Tympanic membrane and external ear normal.     Nose: Congestion (on left side) present.     Mouth/Throat:     Mouth: Mucous membranes are moist.     Pharynx: Oropharynx is clear.  Eyes:     Conjunctiva/sclera: Conjunctivae normal.  Cardiovascular:     Rate and Rhythm: Normal rate and regular rhythm.     Heart sounds: Normal heart sounds. No murmur heard.    No friction rub. No gallop.  Pulmonary:     Effort: Pulmonary effort is normal.     Breath sounds: Normal breath sounds. No wheezing, rhonchi or rales.  Musculoskeletal:     Cervical back: Neck supple.  Skin:    General: Skin is warm.     Findings: No rash.  Neurological:     Mental Status: He is alert and oriented to person, place, and time.  Psychiatric:        Behavior: Behavior normal.    Previous notes and tests were reviewed. The plan was reviewed with the patient/family, and all questions/concerned were addressed.  It was my pleasure to see Martin [name] today and participate in his care. Please feel free to contact me with any questions or concerns.  Sincerely,  Rexene Alberts, DO Allergy & Immunology  [facility] [place] office: [place] office: [phone]

## 2022-10-08 ENCOUNTER — Ambulatory Visit: Payer: BC Managed Care – PPO | Admitting: Allergy

## 2022-10-08 ENCOUNTER — Encounter: Payer: Self-pay | Admitting: Allergy

## 2022-10-08 VITALS — BP 140/90 | HR 83 | Temp 98.1°F | Resp 18

## 2022-10-08 DIAGNOSIS — L501 Idiopathic urticaria: Secondary | ICD-10-CM | POA: Diagnosis not present

## 2022-10-08 DIAGNOSIS — R030 Elevated blood-pressure reading, without diagnosis of hypertension: Secondary | ICD-10-CM | POA: Diagnosis not present

## 2022-10-08 DIAGNOSIS — J3089 Other allergic rhinitis: Secondary | ICD-10-CM | POA: Diagnosis not present

## 2022-10-08 DIAGNOSIS — L509 Urticaria, unspecified: Secondary | ICD-10-CM

## 2022-10-08 DIAGNOSIS — R0602 Shortness of breath: Secondary | ICD-10-CM | POA: Diagnosis not present

## 2022-10-08 NOTE — Assessment & Plan Note (Signed)
Past history - daily hive outbreaks x 2 months. Slowly improving but it's making him anxious and stressed. Apparently had was diagnosed with bronchitis and was treated with prednisone, hycodan and albuterol right before onset. Did not check for flu/Covid-19. Now also has some episodes of shortness of breath. Follows with pulm and cards. Tried antihistamines with minimal benefit. Only prednisone helps. Denies recent tick bites. No pets at home. 2024 skin prick testing was positive to cat, horse and one mold. Negative to select foods.  Interim history - 2024 bloodwork (CBC diff, CMP, TSH, ANA, ESR, Crp, CU, tryptase and alpha gal) all normal. Doing much better. Continue allegra (fexofenadine) '180mg'$  twice a day. If symptoms are not controlled or causes drowsiness let us know. Continue Pepcid (famotidine) '20mg'$  twice a day.  If you have no symptoms after 1 month then do the following:  Decrease Pepcid to '20mg'$  once a day. Continue with allegra '180mg'$  twice a day. If no hives/itching for 2 weeks then: Decrease allegra '180mg'$  to once a day. Continue Pepcid '20mg'$  once a day. If no hives/itching for 2 weeks then: Stop Pepcid completely. Continue allegra '180mg'$  once a day.  If you get hives then go back to the dose where you didn't have any symptoms.   May take hydroxyzine '10mg'$  to '20mg'$  every 8 hours as needed for breakthrough rash/itching. Avoid the following potential triggers: alcohol, tight clothing, NSAIDs, hot showers and getting overheated. If symptoms worsen - then let us know there are other options such as Xolair injections we can do - handout given.  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 Discussed with patient, that urticaria is usually caused by release of histamine by cutaneous mast cells but sometimes it is non-histamine mediated. Explained that urticaria is not always associated with allergies. In most cases, the exact etiology for urticaria can not be established and it  is considered idiopathic.

## 2022-10-08 NOTE — Assessment & Plan Note (Signed)
Monitor blood pressure. Follow up with PCP regarding this.

## 2022-10-08 NOTE — Patient Instructions (Addendum)
Hives: Continue allegra (fexofenadine) '180mg'$  twice a day. If symptoms are not controlled or causes drowsiness let us know. Continue Pepcid (famotidine) '20mg'$  twice a day.   If you have no symptoms after 1 month then do the following:  Decrease Pepcid to '20mg'$  once a day. Continue with allegra '180mg'$  twice a day. If no hives/itching for 2 weeks then: Decrease allegra '180mg'$  to once a day. Continue Pepcid '20mg'$  once a day. If no hives/itching for 2 weeks then: Stop Pepcid completely. Continue allegra '180mg'$  once a day.  If you get hives then go back to the dose where you didn't have any symptoms.   May take hydroxyzine '10mg'$  to '20mg'$  every 8 hours as needed for breakthrough rash/itching. Avoid the following potential triggers: alcohol, tight clothing, NSAIDs, hot showers and getting overheated. If symptoms worsen - then let us know there are other options such as Xolair injections we can do - handout given.   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 Discussed with patient, that urticaria is usually caused by release of histamine by cutaneous mast cells but sometimes it is non-histamine mediated. Explained that urticaria is not always associated with allergies. In most cases, the exact etiology for urticaria can not be established and it is considered idiopathic.  Environmental allergies 2024 skin testing was positive to cat, horse and one mold. Continue environmental control measures.  Shortness of breath Keep follow up with pulmonology and cardiology.  Follow up in 3-4 months or sooner if needed.   Skin care recommendations  Bath time: Always use lukewarm water. AVOID very hot or cold water. Keep bathing time to 5-10 minutes. Do NOT use bubble bath. Use a mild soap and use just enough to wash the dirty areas. Do NOT scrub skin vigorously.  After bathing, pat dry your skin with a towel. Do NOT rub or scrub the skin.  Moisturizers and prescriptions:  ALWAYS  apply moisturizers immediately after bathing (within 3 minutes). This helps to lock-in moisture. Use the moisturizer several times a day over the whole body. Good summer moisturizers include: Aveeno, CeraVe, Cetaphil. Good winter moisturizers include: Aquaphor, Vaseline, Cerave, Cetaphil, Eucerin, Vanicream. When using moisturizers along with medications, the moisturizer should be applied about one hour after applying the medication to prevent diluting effect of the medication or moisturize around where you applied the medications. When not using medications, the moisturizer can be continued twice daily as maintenance.  Laundry and clothing: Avoid laundry products with added color or perfumes. Use unscented hypo-allergenic laundry products such as Tide free, Cheer free & gentle, and All free and clear.  If the skin still seems dry or sensitive, you can try double-rinsing the clothes. Avoid tight or scratchy clothing such as wool. Do not use fabric softeners or dyer sheets.   Pet Allergen Avoidance: Contrary to popular opinion, there are no "hypoallergenic" breeds of dogs or cats. That is because people are not allergic to an animal's hair, but to an allergen found in the animal's saliva, dander (dead skin flakes) or urine. Pet allergy symptoms typically occur within minutes. For some people, symptoms can build up and become most severe 8 to 12 hours after contact with the animal. People with severe allergies can experience reactions in public places if dander has been transported on the pet owners' clothing. Keeping an animal outdoors is only a partial solution, since homes with pets in the yard still have higher concentrations of animal allergens. Before getting a pet, ask your allergist to  determine if you are allergic to animals. If your pet is already considered part of your family, try to minimize contact and keep the pet out of the bedroom and other rooms where you spend a great deal of  time. As with dust mites, vacuum carpets often or replace carpet with a hardwood floor, tile or linoleum. High-efficiency particulate air (HEPA) cleaners can reduce allergen levels over time. While dander and saliva are the source of cat and dog allergens, urine is the source of allergens from rabbits, hamsters, mice and [place] pigs; so ask a non-allergic family member to clean the animal's cage. If you have a pet allergy, talk to your allergist about the potential for allergy immunotherapy (allergy shots). This strategy can often provide long-term relief. Mold Control Mold and fungi can grow on a variety of surfaces provided certain temperature and moisture conditions exist.  Outdoor molds grow on plants, decaying vegetation and soil. The major outdoor mold, Alternaria and Cladosporium, are found in very high numbers during hot and dry conditions. Generally, a late summer - fall peak is seen for common outdoor fungal spores. Rain will temporarily lower outdoor mold spore count, but counts rise rapidly when the rainy period ends. The most important indoor molds are Aspergillus and Penicillium. Dark, humid and poorly ventilated basements are ideal sites for mold growth. The next most common sites of mold growth are the bathroom and the kitchen. Outdoor (Seasonal) Mold Control Use air conditioning and keep windows closed. Avoid exposure to decaying vegetation. Avoid leaf raking. Avoid grain handling. Consider wearing a face mask if working in moldy areas.  Indoor (Perennial) Mold Control  Maintain humidity below 50%. Get rid of mold growth on hard surfaces with water, detergent and, if necessary, 5% bleach (do not mix with other cleaners). Then dry the area completely. If mold covers an area more than 10 square feet, consider hiring an indoor environmental professional. For clothing, washing with soap and water is best. If moldy items cannot be cleaned and dried, throw them away. Remove sources e.g.  contaminated carpets. Repair and seal leaking roofs or pipes. Using dehumidifiers in damp basements may be helpful, but empty the water and clean units regularly to prevent mildew from forming. All rooms, especially basements, bathrooms and kitchens, require ventilation and cleaning to deter mold and mildew growth. Avoid carpeting on concrete or damp floors, and storing items in damp areas.

## 2022-10-08 NOTE — Assessment & Plan Note (Addendum)
Past history - Mild symptoms and spring.  Noticed allergic symptoms around cats and horses in the past. 2024 skin prick testing was positive to cat, horse and one mold. Continue environmental control measures. The above antihistamine should also help with these symptoms.

## 2022-10-08 NOTE — Assessment & Plan Note (Signed)
Being worked up by [employer] and cards. Follow up with them as scheduled.

## 2022-10-14 ENCOUNTER — Ambulatory Visit: Payer: BC Managed Care – PPO | Admitting: Internal Medicine

## 2022-10-14 ENCOUNTER — Encounter: Payer: Self-pay | Admitting: Internal Medicine

## 2022-10-14 VITALS — BP 149/96 | HR 84 | Ht 72.0 in | Wt 262.2 lb

## 2022-10-14 DIAGNOSIS — I10 Essential (primary) hypertension: Secondary | ICD-10-CM

## 2022-10-14 DIAGNOSIS — E782 Mixed hyperlipidemia: Secondary | ICD-10-CM

## 2022-10-14 DIAGNOSIS — I119 Hypertensive heart disease without heart failure: Secondary | ICD-10-CM

## 2022-10-14 MED ORDER — ROSUVASTATIN CALCIUM 20 MG PO TABS: 20.0000 mg | ORAL_TABLET | Freq: Every day | ORAL | 3 refills | Status: DC

## 2022-10-14 MED ORDER — LISINOPRIL 5 MG PO TABS: 5.0000 mg | ORAL_TABLET | Freq: Every day | ORAL | 3 refills | Status: DC

## 2022-10-14 MED ORDER — SPIRONOLACTONE 25 MG PO TABS: 12.5000 mg | ORAL_TABLET | Freq: Every day | ORAL | 3 refills | Status: DC

## 2022-10-14 MED ORDER — METOPROLOL SUCCINATE ER 25 MG PO TB24: 25.0000 mg | ORAL_TABLET | Freq: Every day | ORAL | 3 refills | Status: DC

## 2022-10-14 NOTE — Progress Notes (Signed)
Primary Physician/Referring:  Iona Beard, MD  Patient ID: Martin [name], male    DOB: 1970-09-21, 52 y.o.   MRN: [id number]  Chief Complaint  Patient presents with   [name]   Follow-up   Results   HPI:    Martin [name]  is a 52 y.o. male with past medical history significant for hypertension and hyperlipidemia who is here for follow-up visit.  He has been complaining of palpitations and rapid heart rates.  He thinks this is from his ARB combination pill and he admits that he has only been taking half of it and today he did not take it at all.  He is feeling better since the last time he was here.  Patient was able to see an allergist and has eliminated these triggers from his home.  He denies chest pain, shortness of breath, diaphoresis, syncope, orthopnea, edema, PND, claudication.  Past Medical History:  Diagnosis Date   HSV (herpes simplex virus) infection    Hyperlipidemia    Hypertension    Urticaria    Past Surgical History:  Procedure Laterality Date   ANKLE SURGERY  02/14/2008   Right Leg/Right Ankle   APPENDECTOMY     EAR CYST EXCISION N/A 04/17/2013   Procedure: CYST REMOVAL ON SCALP X2, BACK;  Surgeon: Harl Bowie, MD;  Location: [facility];  Service: General;  Laterality: N/A;  head and back   KNEE SURGERY Left 07/02/2022   Family History  Problem Relation Age of Onset   Allergic rhinitis Mother     Social History   Tobacco Use   Smoking status: Every Day    Packs/day: 0.50    Years: 12.00    Total pack years: 6.00    Types: Cigarettes   Smokeless tobacco: Never   Tobacco comments:    Pt states he is smoking about 3 ciggs per day now. ALS 08/12/22  Substance Use Topics   Alcohol use: Yes    Comment: Twice/week.   Marital Status: Single  ROS  Review of Systems  Cardiovascular:  Positive for irregular heartbeat and palpitations.  Respiratory:  Negative for shortness of breath.   Musculoskeletal:  Negative for joint  pain, joint swelling and myalgias.  Allergic/Immunologic: Negative for hives.   Objective  Blood pressure (!) 149/96, pulse 84, height 6' (1.829 m), weight 262 lb 3.2 oz (118.9 kg), SpO2 95 %. Body mass index is 35.56 kg/m.     10/14/2022    3:33 PM 10/08/2022    3:45 PM 09/17/2022    3:47 PM  Vitals with BMI  Height 6' 0"$   6' 0"$   Weight 262 lbs 3 oz  256 lbs  BMI [id number]  123456  Systolic 123456 [id number] 123456  Diastolic 96 90 80  Pulse 84 83 96     Physical Exam Vitals reviewed.  HENT:     Head: Normocephalic and atraumatic.  Cardiovascular:     Rate and Rhythm: Normal rate and regular rhythm.     Pulses: Normal pulses.     Heart sounds: Normal heart sounds. No murmur heard. Pulmonary:     Effort: Pulmonary effort is normal.     Breath sounds: Normal breath sounds.  Abdominal:     General: Bowel sounds are normal.  Musculoskeletal:     Right lower leg: No edema.     Left lower leg: No edema.  Skin:    General: Skin is warm and dry.  Neurological:     Mental  Status: He is alert.     Medications and allergies  No Known Allergies   Medication list after today's encounter   Current Outpatient Medications:    albuterol (VENTOLIN HFA) 108 (90 Base) MCG/ACT inhaler, Inhale 1-2 puffs into the lungs every 6 (six) hours as needed for wheezing or shortness of breath., Disp: 6.7 g, Rfl: 0   amLODipine (NORVASC) 10 MG tablet, Take 10 mg by mouth at bedtime., Disp: , Rfl:    azelastine (ASTELIN) 0.1 % nasal spray, Place 2 sprays into both nostrils 2 (two) times daily. Use in each nostril as directed, Disp: 30 mL, Rfl: 12   Bacitracin-Polymyxin B (NEOSPORIN EX), Apply 1 application. topically daily. Left lower leg, Disp: , Rfl:    famotidine (PEPCID) 20 MG tablet, Take 1 tablet (20 mg total) by mouth 2 (two) times daily., Disp: 60 tablet, Rfl: 2   fexofenadine (ALLEGRA ALLERGY) 180 MG tablet, Take 1 tablet (180 mg total) by mouth in the morning and at bedtime., Disp: 60 tablet, Rfl: 3    hydrocortisone cream 1 %, Apply 1 application. topically daily. Left lower leg, Disp: , Rfl:    hydrOXYzine (ATARAX) 10 MG tablet, Take 1-2 tablets every 8 hours as needed for breakthrough rash/itching., Disp: 60 tablet, Rfl: 1   lisinopril (ZESTRIL) 5 MG tablet, Take 1 tablet (5 mg total) by mouth at bedtime., Disp: 90 tablet, Rfl: 3   meloxicam (MOBIC) 15 MG tablet, Take 1 tablet by mouth once daily, Disp: 30 tablet, Rfl: 0   metoprolol succinate (TOPROL XL) 25 MG 24 hr tablet, Take 1 tablet (25 mg total) by mouth daily., Disp: 90 tablet, Rfl: 3   mupirocin ointment (BACTROBAN) 2 %, Apply 1 application. topically in the morning and at bedtime. Left lower leg, Disp: , Rfl:    rosuvastatin (CRESTOR) 20 MG tablet, Take 1 tablet (20 mg total) by mouth at bedtime., Disp: 90 tablet, Rfl: 3   valACYclovir (VALTREX) 1000 MG tablet, Take 1,000 mg by mouth daily. Continuously, Disp: , Rfl:    Vitamin D, Ergocalciferol, (DRISDOL) 1.25 MG (50000 UNIT) CAPS capsule, Take 1 capsule (50,000 Units total) by mouth every 7 (seven) days., Disp: 5 capsule, Rfl: 6  Laboratory examination:   Lab Results  Component Value Date   NA 141 09/11/2022   K 4.5 09/11/2022   CO2 24 09/11/2022   GLUCOSE 103 (H) 09/11/2022   BUN 13 09/11/2022   CREATININE 0.80 09/11/2022   CALCIUM 9.6 09/11/2022   EGFR 106 09/11/2022   GFRNONAA >60 12/06/2021       Latest Ref Rng & Units 09/11/2022    8:53 AM 09/03/2022    2:37 PM 12/06/2021    9:27 PM  CMP  Glucose 70 - 99 mg/dL 103   88   BUN 6 - 24 mg/dL 13   13   Creatinine 0.76 - 1.27 mg/dL 0.80   0.99   Sodium 134 - 144 mmol/L 141   139   Potassium 3.5 - 5.2 mmol/L 4.5   3.1   Chloride 96 - 106 mmol/L 102   106   CO2 20 - 29 mmol/L 24   24   Calcium 8.7 - 10.2 mg/dL 9.6   8.7   Total Protein 6.0 - 8.5 g/dL 6.8  6.9  7.1   Total Bilirubin 0.0 - 1.2 mg/dL 0.4  0.4  0.4   Alkaline Phos 44 - 121 IU/L 75  74  62   AST 0 - 40 IU/L 10  12  19   ALT 0 - 44 IU/L 11  16  17        $ Latest Ref Rng & Units 09/11/2022    8:53 AM 09/03/2022    2:37 PM 12/06/2021    9:27 PM  CBC  WBC 3.4 - 10.8 x10E3/uL 6.4  9.1  7.5   Hemoglobin 13.0 - 17.7 g/dL 14.7  14.9  14.5   Hematocrit 37.5 - 51.0 % 43.9  44.3  42.8   Platelets 150 - 450 x10E3/uL 284  244  244     Lipid Panel Recent Labs    09/03/22 1437  CHOL 182  TRIG 102  LDLCALC 102*  HDL 62    HEMOGLOBIN A1C Lab Results  Component Value Date   HGBA1C 6.0 (H) 09/03/2022   TSH Recent Labs    09/03/22 1437 09/11/22 0853  TSH 0.573 0.563    External labs:     Radiology:    Cardiac Studies:   Exercise treadmill stress test 09/10/2022: Exercise treadmill stress test performed using Bruce protocol.  Patient reached 8.5 METS, and 92% of age predicted maximum heart rate.  Exercise capacity was good.  No chest pain reported.  Normal heart rate and hemodynamic response. Stress EKG revealed no ischemic changes. Low risk study.   Echocardiogram 09/16/2022: Normal LV systolic function with visual EF 60-65%. Left ventricle cavity is dilated. Moderate left ventricular hypertrophy. Normal global wall motion. Normal diastolic filling pattern, normal LAP. Structurally normal tricuspid valve with trace regurgitation. No evidence of tricuspid valve stenosis. No evidence of pulmonary hypertension. No prior study for comparison.    EKG:   09/02/2022: Sinus Rhythm -Short PR syndrome. Normal R wave progression. No evidence of ischemia  Assessment     ICD-10-CM   1. Essential hypertension  99991111 Basic metabolic panel    2. Mixed hyperlipidemia  E78.2     3. Hypertensive heart disease without heart failure  I11.9        Orders Placed This Encounter  Procedures   Basic metabolic panel    Meds ordered this encounter  Medications   rosuvastatin (CRESTOR) 20 MG tablet    Sig: Take 1 tablet (20 mg total) by mouth at bedtime.    Dispense:  90 tablet    Refill:  3   DISCONTD: spironolactone (ALDACTONE) 25  MG tablet    Sig: Take 0.5 tablets (12.5 mg total) by mouth daily.    Dispense:  90 tablet    Refill:  3   metoprolol succinate (TOPROL XL) 25 MG 24 hr tablet    Sig: Take 1 tablet (25 mg total) by mouth daily.    Dispense:  90 tablet    Refill:  3   lisinopril (ZESTRIL) 5 MG tablet    Sig: Take 1 tablet (5 mg total) by mouth at bedtime.    Dispense:  90 tablet    Refill:  3    Medications Discontinued During This Encounter  Medication Reason   valsartan (DIOVAN) 80 MG tablet Change in therapy   spironolactone (ALDACTONE) 25 MG tablet    OLMESARTAN MEDOXOMIL-HCTZ PO      Recommendations:   MUCAAD SCHIERMAN is a 52 y.o.  male with hypertension   Mixed hyperlipidemia  Lipid panel shows elevated LDL Patient is agreeable to starting Crestor Will repeat lipid panel at our next visit   Essential hypertension We will discontinue his ARB/HCTZ combo as he thinks this is giving him palpitations Start lisinopril 5 mg every  night and we will check BMP in 1 week Will add Toprol 25 mg to be taken daily which should help blood pressure and his palpitations Encourage low-sodium diet, less Martin 2000 mg daily. Echocardiogram shows moderate LVH   Hypertensive heart disease w/out heart failure I have extensively discussed the importance of medication compliance with the patient He already has moderate concentric hypertrophy on echocardiogram He admits noncompliance with medications that make him feel worse I have gone through all of his medications with him and we have come up with a regiment that works for him    Floydene Flock, [place], [facility]  10/18/2022, 2:12 PM Office: [phone] Pager: [phone]

## 2022-10-22 LAB — BASIC METABOLIC PANEL
BUN/Creatinine Ratio: 19 (ref 9–20)
BUN: 14 mg/dL (ref 6–24)
CO2: 23 mmol/L (ref 20–29)
Calcium: 9.3 mg/dL (ref 8.7–10.2)
Chloride: 99 mmol/L (ref 96–106)
Creatinine, Ser: 0.73 mg/dL — ABNORMAL LOW (ref 0.76–1.27)
Glucose: 78 mg/dL (ref 70–99)
Potassium: 4.6 mmol/L (ref 3.5–5.2)
Sodium: 138 mmol/L (ref 134–144)
eGFR: 109 mL/min/{1.73_m2} (ref >59)

## 2022-12-29 ENCOUNTER — Ambulatory Visit (INDEPENDENT_AMBULATORY_CARE_PROVIDER_SITE_OTHER): Payer: BC Managed Care – PPO | Admitting: Adult Health

## 2022-12-29 DIAGNOSIS — G4733 Obstructive sleep apnea (adult) (pediatric): Secondary | ICD-10-CM

## 2022-12-29 DIAGNOSIS — G4730 Sleep apnea, unspecified: Secondary | ICD-10-CM

## 2022-12-30 NOTE — Progress Notes (Signed)
Home sleep study does demonstrate moderate OSA.  I recommend an auto titrating CPAP machine 5 to 20 cm of water.  Can you please assist in placing this order and letting the patient know.  He will need to contact us to set an appointment for CPAP adherence 31 to 90 days after receiving machine.  Thank you!

## 2023-01-07 ENCOUNTER — Ambulatory Visit: Payer: BC Managed Care – PPO | Admitting: Allergy

## 2023-01-12 ENCOUNTER — Ambulatory Visit: Payer: BC Managed Care – PPO | Admitting: Internal Medicine

## 2023-01-19 ENCOUNTER — Ambulatory Visit: Payer: BC Managed Care – PPO | Admitting: Internal Medicine

## 2023-02-15 NOTE — Progress Notes (Deleted)
Office Visit Note  Patient: Martin [name]             Date of Birth: 22-Jan-1971           MRN: [id number]             PCP: Mirna Mires, MD Referring: Clotilde Dieter, DO Visit Date: 03/01/2023 Occupation: @GUAROCC @  Subjective:  No chief complaint on file.   History of Present Illness: Martin [name] is a 52 y.o. male ***seen in consultation per request of Dr.Custovic, his cardiologist for the evaluation of joint pain.    History of rash since November 2023.  Rash initially started on his waist.  He describes the rash as red raised itchy.  Symptoms almost occur daily.  Symptoms persisted after avoiding red meat because of possible alpha gal allergy.  He had been giving systemic steroids for the rash.  He failed hydroxyzine and cetirizine.  He was evaluated by Dr. Selena Batten and had an extensive allergy testing.  Dr. Selena Batten diagnosed him with urticaria.  He also placed him on Allegra 180 mg twice daily and Pepcid 20 mg twice daily.  Hydroxyzine 10 mg to 20 mg every 8 hours as needed for breakthrough rash in January 2024. He was also evaluated by Dr. Judeth Horn for shortness of breath.  He was evaluated by Dr. Maple [name] in the past.  After evaluation Dr. Judeth Horn diagnosed with possible asthma.  He also did sleep study which was consistent with OSA.  He had a CT chest  low-dose lung cancer screening on July 01, 2022 which showed a benign nodule.  Findings also showed emphysema, aortic atherosclerosis and possible pulmonary arterial hypertension due to enlargement of pulmonary outflow tract and main pulmonary arteries.  Activities of Daily Living:  Patient reports morning stiffness for *** {minute/hour:19697}.   Patient {ACTIONS;DENIES/REPORTS:21021675::"Denies"} nocturnal pain.  Difficulty dressing/grooming: {ACTIONS;DENIES/REPORTS:21021675::"Denies"} Difficulty climbing stairs: {ACTIONS;DENIES/REPORTS:21021675::"Denies"} Difficulty getting out of chair:  {ACTIONS;DENIES/REPORTS:21021675::"Denies"} Difficulty using hands for taps, buttons, cutlery, and/or writing: {ACTIONS;DENIES/REPORTS:21021675::"Denies"}  No Rheumatology ROS completed.   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/2011   HYPERTENSION 12/20/2008   LUNG NODULE 12/20/2008    Past Medical History:  Diagnosis Date   HSV (herpes simplex virus) infection    Hyperlipidemia    Hypertension    Urticaria     Family History  Problem Relation Age of Onset   Allergic rhinitis Mother    Past Surgical History:  Procedure Laterality Date   ANKLE SURGERY  02/14/2008   Right Leg/Right Ankle   APPENDECTOMY     EAR CYST EXCISION N/A 04/17/2013   Procedure: CYST REMOVAL ON SCALP X2, BACK;  Surgeon: Shelly Rubenstein, MD;  Location: [facility];  Service: General;  Laterality: N/A;  head and back   KNEE SURGERY Left 07/02/2022   Social History   Social History Narrative   Lives alone.   Immunization History  Administered Date(s) Administered   Hepatitis B, ADULT 05/21/2014, 06/21/2014, 11/20/2014   Influenza,inj,Quad PF,6+ Mos 05/21/2014     Objective: Vital Signs: There were no vitals taken for this visit.   Physical Exam   Musculoskeletal Exam: ***  CDAI Exam: CDAI Score: -- Patient Global: --; Provider Global: -- Swollen: --; Tender: --  Joint Exam 03/01/2023   No joint exam has been documented for this visit   There is currently no information documented on the homunculus. Go to the Rheumatology activity and complete the homunculus joint exam.  Investigation: No additional findings.  Imaging: No results found.  Recent Labs: Lab Results  Component Value Date   WBC 6.4  09/11/2022   HGB 14.7 09/11/2022   PLT 284 09/11/2022   NA 138 10/21/2022   K 4.6 10/21/2022   CL 99 10/21/2022   CO2 23 10/21/2022   GLUCOSE 78 10/21/2022   BUN 14 10/21/2022   CREATININE 0.73 (L) 10/21/2022   BILITOT 0.4 09/11/2022   ALKPHOS 75 09/11/2022   AST 10 09/11/2022   ALT 11 09/11/2022   PROT 6.8 09/11/2022   ALBUMIN 4.5 09/11/2022   CALCIUM 9.3 10/21/2022   GFRAA >89 02/21/2016   September 03, 2022 ANA negative, ENA (dsDNA, RNP, Smith, SCL 70, SSA, SSB) negative, ESR 19 September 11, 2022 ANA negative, CRP 3, TSH normal, C3-C4 normal  Speciality Comments: No specialty comments available.  Procedures:  No procedures performed Allergies: Patient has no known allergies.   Assessment / Plan:     Visit Diagnoses: No diagnosis found.  Orders: No orders of the defined types were placed in this encounter.  No orders of the defined types were placed in this encounter.   Face-to-face time spent with patient was *** minutes. Greater than 50% of time was spent in counseling and coordination of care.  Follow-Up Instructions: No follow-ups on file.   Pollyann Savoy, MD  Note - This record has been created using Animal nutritionist.  Chart creation errors have been sought, but may not always  have been located. Such creation errors do not reflect on  the standard of medical care.

## 2023-03-01 ENCOUNTER — Encounter: Payer: BC Managed Care – PPO | Admitting: Rheumatology

## 2023-03-01 DIAGNOSIS — M19072 Primary osteoarthritis, left ankle and foot: Secondary | ICD-10-CM

## 2023-03-01 DIAGNOSIS — J3089 Other allergic rhinitis: Secondary | ICD-10-CM

## 2023-03-01 DIAGNOSIS — L501 Idiopathic urticaria: Secondary | ICD-10-CM

## 2023-03-01 DIAGNOSIS — G4733 Obstructive sleep apnea (adult) (pediatric): Secondary | ICD-10-CM

## 2023-03-01 DIAGNOSIS — M19012 Primary osteoarthritis, left shoulder: Secondary | ICD-10-CM

## 2023-03-01 DIAGNOSIS — I493 Ventricular premature depolarization: Secondary | ICD-10-CM

## 2023-03-01 DIAGNOSIS — M1712 Unilateral primary osteoarthritis, left knee: Secondary | ICD-10-CM

## 2023-03-01 DIAGNOSIS — E782 Mixed hyperlipidemia: Secondary | ICD-10-CM

## 2023-03-01 DIAGNOSIS — I10 Essential (primary) hypertension: Secondary | ICD-10-CM

## 2023-03-01 DIAGNOSIS — M2550 Pain in unspecified joint: Secondary | ICD-10-CM

## 2023-03-17 NOTE — Progress Notes (Deleted)
Office Visit Note  Patient: [name]             Date of Birth: 05-Sep-1971           MRN: [id number]             PCP: Mirna Mires, MD Referring: Clotilde Dieter, DO Visit Date: 03/31/2023 Occupation: @GUAROCC @  Subjective:  No chief complaint on file.   History of Present Illness: Martin [name] is a 52 y.o. male ***seen in consultation per request of his cardiologist for joint pain. Patient is followed by allergist for urticaria.  He has been on Allegra 180 mg twice daily and famotidine 20 mg twice daily.  He also takes hydroxyzine 10 mg to 20 mg every 8 hours as needed rash and itching. Patient was evaluated by severe episodic shortness of breath by Dr. Judeth Horn.  PFTs were unremarkable.  Sleep study was advised to rule out OSA.  Activities of Daily Living:  Patient reports morning stiffness for *** {minute/hour:19697}.   Patient {ACTIONS;DENIES/REPORTS:21021675::"Denies"} nocturnal pain.  Difficulty dressing/grooming: {ACTIONS;DENIES/REPORTS:21021675::"Denies"} Difficulty climbing stairs: {ACTIONS;DENIES/REPORTS:21021675::"Denies"} Difficulty getting out of chair: {ACTIONS;DENIES/REPORTS:21021675::"Denies"} Difficulty using hands for taps, buttons, cutlery, and/or writing: {ACTIONS;DENIES/REPORTS:21021675::"Denies"}  No Rheumatology ROS completed.   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/2011   HYPERTENSION 12/20/2008   LUNG NODULE 12/20/2008    Past Medical History:  Diagnosis Date   HSV (herpes simplex virus) infection    Hyperlipidemia    Hypertension    Urticaria     Family History  Problem Relation Age of Onset   Allergic rhinitis Mother    Past Surgical History:   Procedure Laterality Date   ANKLE SURGERY  02/14/2008   Right Leg/Right Ankle   APPENDECTOMY     EAR CYST EXCISION N/A 04/17/2013   Procedure: CYST REMOVAL ON SCALP X2, BACK;  Surgeon: Shelly Rubenstein, MD;  Location: [facility] OR;  Service: General;  Laterality: N/A;  head and back   KNEE SURGERY Left 07/02/2022   Social History   Social History Narrative   Lives alone.   Immunization History  Administered Date(s) Administered   Hepatitis B, ADULT 05/21/2014, 06/21/2014, 11/20/2014   Influenza,inj,Quad PF,6+ Mos 05/21/2014     Objective: Vital Signs: There were no vitals taken for this visit.   Physical Exam   Musculoskeletal Exam: ***  CDAI Exam: CDAI Score: -- Patient Global: --; Provider Global: -- Swollen: --; Tender: -- Joint Exam 03/31/2023   No joint exam has been documented for this visit   There is currently no information documented on the homunculus. Go to the Rheumatology activity and complete the homunculus joint exam.  Investigation: No additional findings.  Imaging: No results found.  Recent Labs: Lab Results  Component Value Date   WBC 6.4 09/11/2022   HGB 14.7 09/11/2022   PLT 284 09/11/2022   NA 138 10/21/2022   K 4.6 10/21/2022   CL 99 10/21/2022   CO2 23 10/21/2022   GLUCOSE 78 10/21/2022   BUN 14 10/21/2022   CREATININE 0.73 (L) 10/21/2022   BILITOT 0.4 09/11/2022   ALKPHOS 75 09/11/2022   AST 10 09/11/2022   ALT 11 09/11/2022   PROT 6.8 09/11/2022   ALBUMIN 4.5  09/11/2022   CALCIUM 9.3 10/21/2022   GFRAA >89 02/21/2016    September 03, 2022 ANA negative, ENA (dsDNA, RNP, Smith, SCL 70, SSA, SSB) negative, C3-C4 normal, ESR 19, C-reactive protein normal, vitamin D 11, B12 normal, folate normal, TSH normal, iron studies normal September 11, 2022 ANA negative, C-reactive protein 3, sed rate 6, C3-C4 normal  Speciality Comments: No specialty comments available.  Procedures:  No procedures performed Allergies: Patient has no known  allergies.   Assessment / Plan:     Visit Diagnoses: No diagnosis found.  Orders: No orders of the defined types were placed in this encounter.  No orders of the defined types were placed in this encounter.   Face-to-face time spent with patient was *** minutes. Greater than 50% of time was spent in counseling and coordination of care.  Follow-Up Instructions: No follow-ups on file.   Pollyann Savoy, MD  Note - This record has been created using Animal nutritionist.  Chart creation errors have been sought, but may not always  have been located. Such creation errors do not reflect on  the standard of medical care.

## 2023-03-31 ENCOUNTER — Encounter: Payer: BC Managed Care – PPO | Admitting: Rheumatology

## 2023-03-31 DIAGNOSIS — Z720 Tobacco use: Secondary | ICD-10-CM

## 2023-03-31 DIAGNOSIS — L501 Idiopathic urticaria: Secondary | ICD-10-CM

## 2023-03-31 DIAGNOSIS — I10 Essential (primary) hypertension: Secondary | ICD-10-CM

## 2023-03-31 DIAGNOSIS — B009 Herpesviral infection, unspecified: Secondary | ICD-10-CM

## 2023-03-31 DIAGNOSIS — J3089 Other allergic rhinitis: Secondary | ICD-10-CM

## 2023-03-31 DIAGNOSIS — M2550 Pain in unspecified joint: Secondary | ICD-10-CM

## 2023-03-31 DIAGNOSIS — R0602 Shortness of breath: Secondary | ICD-10-CM

## 2023-09-28 ENCOUNTER — Telehealth: Payer: Self-pay | Admitting: *Deleted

## 2023-09-28 ENCOUNTER — Other Ambulatory Visit: Payer: Self-pay | Admitting: *Deleted

## 2023-09-28 DIAGNOSIS — Z87891 Personal history of nicotine dependence: Secondary | ICD-10-CM

## 2023-09-28 DIAGNOSIS — Z122 Encounter for screening for malignant neoplasm of respiratory organs: Secondary | ICD-10-CM

## 2023-09-28 DIAGNOSIS — F17210 Nicotine dependence, cigarettes, uncomplicated: Secondary | ICD-10-CM

## 2023-09-28 NOTE — Telephone Encounter (Signed)
Lung Cancer Screening Narrative/Criteria Questionnaire (Cigarette Smokers Only- No Cigars/Pipes/vapes)   Martin [name]   SDMV:10/04/23 11:00    [name]  July 14, 1971              LDCT: 10/06/23 4:00 GI 315    52 y.o.   Phone: [phone]  Lung Screening Narrative (confirm age 9-77 yrs Medicare / 50-80 yrs Private pay insurance)   Insurance information:[employer]    Referring Provider:Veta Parke Simmers   This screening involves an initial phone call with a team member from our program. It is called a shared decision making visit. The initial meeting is required by  insurance and Medicare to make sure you understand the program. This appointment takes about 15-20 minutes to complete. You will complete the screening scan at your scheduled date/time.  This scan takes about 5-10 minutes to complete. You can eat and drink normally before and after the scan.  Criteria questions for Lung Cancer Screening:   Are you a current or former smoker? Current Age began smoking: 24   If you are a former smoker, what year did you quit smoking? (within 15 yrs)   To calculate your smoking history, I need an accurate estimate of how many packs of cigarettes you smoked per day and for how many years. (Not just the number of PPD you are now smoking)   Years smoking 28 x Packs per day 1/2-1 = Pack years 20   (at least 20 pack yrs)   (Make sure they understand that we need to know how much they have smoked in the past, not just the number of PPD they are smoking now)  Do you have a personal history of cancer?  No    Do you have a family history of cancer? Yes  (cancer type and and relative) Father = Prostate  Are you coughing up blood?  No  Have you had unexplained weight loss of 15 lbs or more in the last 6 months? No  It looks like you meet all criteria.  When would be a good time for [place] to schedule you for this screening?   Additional information: N/A

## 2023-10-04 ENCOUNTER — Encounter: Payer: Self-pay | Admitting: Adult Health

## 2023-10-04 ENCOUNTER — Ambulatory Visit (INDEPENDENT_AMBULATORY_CARE_PROVIDER_SITE_OTHER): Payer: 59 | Admitting: Adult Health

## 2023-10-04 DIAGNOSIS — F17210 Nicotine dependence, cigarettes, uncomplicated: Secondary | ICD-10-CM

## 2023-10-04 NOTE — Progress Notes (Signed)
  Virtual Visit via Telephone Note  I connected with Martin [name] , 10/04/23 11:03 AM by a telemedicine application and verified that I am speaking with the correct person using two identifiers.  Location: Patient: home Provider: home   I discussed the limitations of evaluation and management by telemedicine and the availability of in person appointments. The patient expressed understanding and agreed to proceed.   Shared Decision Making Visit Lung Cancer Screening Program [phone])   Eligibility: 53 y.o. Pack Years Smoking History Calculation = 20 pack years  (# packs/per year x # years smoked) Recent History of coughing up blood  no Unexplained weight loss? no ( >Than 15 pounds within the last 6 months ) Prior History Lung / other cancer no (Diagnosis within the last 5 years already requiring surveillance chest CT Scans). Smoking Status Current Smoker   Visit Components: Discussion included one or more decision making aids. YES Discussion included risk/benefits of screening. YES Discussion included potential follow up diagnostic testing for abnormal scans. YES Discussion included meaning and risk of over diagnosis. YES Discussion included meaning and risk of False Positives. YES Discussion included meaning of total radiation exposure. YES  Counseling Included: Importance of adherence to annual lung cancer LDCT screening. YES Impact of comorbidities on ability to participate in the program. YES Ability and willingness to under diagnostic treatment. YES  Smoking Cessation Counseling: Current Smokers:  Discussed importance of smoking cessation. yes Information about tobacco cessation classes and interventions provided to patient. yes Patient provided with "ticket" for LDCT Scan. yes Symptomatic Patient. NO Diagnosis Code: Tobacco Use Z72.0 Asymptomatic Patient yes  Counseling (Intermediate counseling: > three minutes counseling) U0454  Z12.2-Screening of respiratory  organs Z87.891-Personal history of nicotine dependence   Danford Bad 10/04/23

## 2023-10-04 NOTE — Patient Instructions (Signed)

## 2023-10-06 ENCOUNTER — Ambulatory Visit
Admission: RE | Admit: 2023-10-06 | Discharge: 2023-10-06 | Disposition: A | Discharge status: Home / Self Care | Payer: 59 | Source: Ambulatory Visit | Attending: Acute Care | Admitting: Acute Care

## 2023-10-06 DIAGNOSIS — Z122 Encounter for screening for malignant neoplasm of respiratory organs: Secondary | ICD-10-CM

## 2023-10-06 DIAGNOSIS — Z87891 Personal history of nicotine dependence: Secondary | ICD-10-CM

## 2023-10-06 DIAGNOSIS — F17210 Nicotine dependence, cigarettes, uncomplicated: Secondary | ICD-10-CM

## 2023-10-20 ENCOUNTER — Telehealth: Payer: Self-pay

## 2023-10-20 NOTE — Telephone Encounter (Signed)
Recent CT results reviewed with [place] Lodge, NP. Advises 6 month CT is needed due to nodule growth. . Left adrenal adenoma, Enlarged pulmonic trunk, indicative of pulmonary arterial Hypertension and Emphysema is noted on the scan and should be reviewed with patient. Patient will be due for repeat scan 04/04/2024. Please fax results and plan to PCP. Please order 6 month scan.

## 2023-10-21 ENCOUNTER — Other Ambulatory Visit: Payer: Self-pay

## 2023-10-21 DIAGNOSIS — R911 Solitary pulmonary nodule: Secondary | ICD-10-CM

## 2023-10-21 DIAGNOSIS — Z122 Encounter for screening for malignant neoplasm of respiratory organs: Secondary | ICD-10-CM

## 2023-10-21 DIAGNOSIS — Z87891 Personal history of nicotine dependence: Secondary | ICD-10-CM

## 2023-10-21 DIAGNOSIS — F17210 Nicotine dependence, cigarettes, uncomplicated: Secondary | ICD-10-CM

## 2023-10-21 NOTE — Telephone Encounter (Signed)
Called patient to review results. No answer. LVM to call office and review results.

## 2023-10-21 NOTE — Telephone Encounter (Signed)
Spoke with patient and reviewed results. Advised 6 month needed do to nodule growth. Now 16 mm. Pt verbalized understanding. Order placed and due 04/04/2024. Results and plan faxed to PCP. All questions answered.

## 2024-04-24 ENCOUNTER — Ambulatory Visit
Admission: RE | Admit: 2024-04-24 | Discharge: 2024-04-24 | Disposition: A | Discharge status: Home / Self Care | Source: Ambulatory Visit | Attending: Acute Care | Admitting: Acute Care

## 2024-04-24 DIAGNOSIS — Z122 Encounter for screening for malignant neoplasm of respiratory organs: Secondary | ICD-10-CM

## 2024-04-24 DIAGNOSIS — Z87891 Personal history of nicotine dependence: Secondary | ICD-10-CM

## 2024-04-24 DIAGNOSIS — R911 Solitary pulmonary nodule: Secondary | ICD-10-CM

## 2024-04-24 DIAGNOSIS — F17210 Nicotine dependence, cigarettes, uncomplicated: Secondary | ICD-10-CM

## 2024-05-10 ENCOUNTER — Telehealth: Payer: Self-pay | Admitting: Acute Care

## 2024-05-10 NOTE — Telephone Encounter (Signed)
 LR 2, 12 month follow up on this is fine. The nodule is a bit smaller. Thanks so much.

## 2024-05-10 NOTE — Telephone Encounter (Signed)
 LR 2, stable lung nodules.

## 2024-05-11 ENCOUNTER — Other Ambulatory Visit: Payer: Self-pay

## 2024-05-11 DIAGNOSIS — F17210 Nicotine dependence, cigarettes, uncomplicated: Secondary | ICD-10-CM

## 2024-05-11 DIAGNOSIS — Z122 Encounter for screening for malignant neoplasm of respiratory organs: Secondary | ICD-10-CM

## 2024-05-11 DIAGNOSIS — Z87891 Personal history of nicotine dependence: Secondary | ICD-10-CM

## 2024-05-11 NOTE — Telephone Encounter (Signed)
 Result letter sent. Annual CT Order placed. Results and plan to PCP.

## 2024-06-20 ENCOUNTER — Encounter: Payer: Self-pay | Admitting: Allergy

## 2024-06-20 ENCOUNTER — Ambulatory Visit (INDEPENDENT_AMBULATORY_CARE_PROVIDER_SITE_OTHER): Admitting: Allergy

## 2024-06-20 ENCOUNTER — Encounter (INDEPENDENT_AMBULATORY_CARE_PROVIDER_SITE_OTHER): Payer: Self-pay

## 2024-06-20 ENCOUNTER — Other Ambulatory Visit: Payer: Self-pay

## 2024-06-20 ENCOUNTER — Telehealth: Payer: Self-pay | Admitting: Allergy

## 2024-06-20 VITALS — BP 116/74 | HR 91 | Temp 97.5°F | Resp 18 | Ht 72.0 in | Wt 274.8 lb

## 2024-06-20 DIAGNOSIS — J3089 Other allergic rhinitis: Secondary | ICD-10-CM

## 2024-06-20 DIAGNOSIS — L501 Idiopathic urticaria: Secondary | ICD-10-CM

## 2024-06-20 MED ORDER — PREDNISONE 10 MG PO TABS: 10.0000 mg | ORAL_TABLET | Freq: Every day | ORAL | 0 refills | Status: DC

## 2024-06-20 MED ORDER — XHANCE 93 MCG/ACT NA EXHU: INHALANT_SUSPENSION | NASAL | 5 refills | Status: DC

## 2024-06-20 MED ORDER — LEVOCETIRIZINE DIHYDROCHLORIDE 5 MG PO TABS: 5.0000 mg | ORAL_TABLET | Freq: Every evening | ORAL | 3 refills | Status: AC

## 2024-06-20 NOTE — Patient Instructions (Addendum)
 Environmental allergies 2024 skin testing positive to cat, horse and one mold. Continue environmental control measures. Start Xhance (fluticasone) nasal spray 1-2 sprays per nostril twice a day as needed for nasal congestion.  Sample given and demonstrated proper use. If this is not covered let us  know.  This will be mailed to you from [facility] pharmacy [phone]) Nasal saline spray (i.e., Simply Saline) or nasal saline lavage (i.e., NeilMed) is recommended as needed and prior to medicated nasal sprays. Take xyzal 5mg  at night. Start prednisone  taper. Prednisone  10mg  tablets - take 2 tablets for 4 days then 1 tablet on day 5.   Refer to ENT. Try NOT to take the over the counter decongestant nasal sprays or the decongestant pills.   Follow up in 2 months or sooner if needed.   Buffered Isotonic Saline Irrigations:  Goal: When you irrigate with the isotonic saline (salt water) it washes mucous and other debris from your nose that could be contributing to your nasal symptoms.   Recipe: Obtain 1 quart jar that is clean Fill with clean (bottled, boiled or distilled) water Add 1-2 heaping teaspoons of salt without iodine If the solution with 2 teaspoons of salt is too strong, adjust the amount down until better tolerated Add 1 teaspoon of [employer] baking soda (pure bicarbonate) Mix ingredients together and store at room temperature and discard after 1 week * Alternatively you can buy pre made salt packets for the NeilMed bottle or there          are other over the counter brands available  Instructions: Warm  cup of the solution in the microwave if desired but be careful not to overheat as this will burn the inside of your nose Stand over a sink (or do it while you shower) and squirt the solution into one side of your nose aiming towards the back of your head Sometimes saying "coca cola" while irrigating can be helpful to prevent fluid from going down your throat  The solution will  travel to the back of your nose and then come out the other side Perform this again on the other side Try to do this twice a day If you are using a nasal spray in addition to the irrigation, irrigate first and then use the topical nasal spray otherwise you will wash the nasal spray out of your nose

## 2024-06-20 NOTE — Progress Notes (Signed)
 Follow Up Note  RE: Martin [name] MRN: [id number] DOB: [birth year] Date of Office Visit: 06/20/2024  Referring provider: Leigh Lung, MD Primary care provider: Leigh Lung, MD  Chief Complaint: Follow-up (Pt reports that he has not had any relief from the congestion, dryness, get light headed. No headaches, post nasal drip. Vincent Zyrtec brand and generic, Claritin brand and generic, Allegra  brand and generic. No reilef also you have the nelimed sinufrin plus. /ENT appointment 08/11/2024)  History of Present Illness: I had the pleasure of seeing Martin [name] for a follow up visit at the [facility] on 06/20/2024. He is a 53 y.o. male, who is being followed for CIU, allergic rhinitis, shortness of breath. His previous allergy  office visit was on 10/08/2022 with Dr. Luke. Today is a new complaint visit of nasal congestion.  Discussed the use of AI scribe software for clinical note transcription with the patient, who gave verbal consent to proceed.    He has been experiencing nasal congestion and difficulty breathing through the nose, particularly at night and early in the morning, describing his nose as 'totally closed up' during these times. This issue has persisted for about three years, with a worsening of symptoms over the past four to five months.  He has been using various treatments including afrin nasal sprays, Claritin, Zyrtec D, cetirizine,  which provide temporary relief for four to six hours. He uses saline rinses but not a neti pot.   No drainage, hives, fevers, or chills. No issues with eating or bathroom habits. He is unsure if he snores at night but uses a nasal spray before bed to help with congestion.  His past medical history includes a previous issue with hives, which has resolved. He has a dog but notes that his symptoms began before acquiring the pet.  Current medications include amlodipine and metoprolol . He denies using azelastine  nose spray,  cholesterol medication, vitamin D , or an inhaler.     Assessment and Plan: Wacey is a 53 y.o. male with: Other allergic rhinitis Past history - Mild symptoms and spring.  Noticed allergic symptoms around cats and horses in the past. 2024 skin prick testing  positive to cat, horse and one mold. Interim history - worsening nasal congestion.  Continue environmental control measures. Start Xhance (fluticasone) nasal spray 1-2 sprays per nostril twice a day as needed for nasal congestion.  Sample given and demonstrated proper use. If this is not covered let us  know.  This will be mailed to you from [facility] pharmacy [phone]) Nasal saline spray (i.e., Simply Saline) or nasal saline lavage (i.e., NeilMed) is recommended as needed and prior to medicated nasal sprays. Take xyzal 5mg  at night. Start prednisone  taper. Prednisone  10mg  tablets - take 2 tablets for 4 days then 1 tablet on day 5.  Refer to ENT. Try NOT to take the over the counter decongestant nasal sprays or the decongestant pills.  Consider retesting and starting AIT if no improvement.   Chronic idiopathic urticaria Past history - daily hive outbreaks x 2 months. Slowly improving but it's making him anxious and stressed. Apparently had was diagnosed with bronchitis and was treated with prednisone , hycodan and albuterol  right before onset. Did not check for flu/Covid-19. Now also has some episodes of shortness of breath. Follows with pulm and cards. Tried antihistamines with minimal benefit. Only prednisone  helps. Denies recent tick bites. No pets at home. 2024 skin prick testing was positive to cat, horse and one mold. Negative to  select foods. 2024 bloodwork (CBC diff, CMP, TSH, ANA, ESR, Crp, CU, tryptase and alpha gal) all normal. Interim history -  Resolved.   Return in about 2 months (around 08/20/2024).  Meds ordered this encounter  Medications   levocetirizine (XYZAL) 5 MG tablet    Sig: Take 1 tablet (5 mg total) by mouth  every evening.    Dispense:  30 tablet    Refill:  3   predniSONE  (DELTASONE ) 10 MG tablet    Sig: Take 1 tablet (10 mg total) by mouth daily with breakfast.    Dispense:  9 tablet    Refill:  0    Start prednisone  taper. Prednisone  10mg  tablets - take 2 tablets for 4 days then 1 tablet on day 5.   DISCONTD: Fluticasone Propionate (XHANCE) 93 MCG/ACT EXHU    Sig: 1-2 sprays per nostril twice a day.    Dispense:  16 mL    Refill:  5   Fluticasone Propionate (XHANCE) 93 MCG/ACT EXHU    Sig: 1-2 sprays per nostril twice a day.    Dispense:  16 mL    Refill:  5   Lab Orders  No laboratory test(s) ordered today    Diagnostics: None.   Medication List:  Current Outpatient Medications  Medication Sig Dispense Refill   amLODipine (NORVASC) 10 MG tablet Take 10 mg by mouth at bedtime.     levocetirizine (XYZAL) 5 MG tablet Take 1 tablet (5 mg total) by mouth every evening. 30 tablet 3   metoprolol  succinate (TOPROL  XL) 25 MG 24 hr tablet Take 1 tablet (25 mg total) by mouth daily. 90 tablet 3   predniSONE  (DELTASONE ) 10 MG tablet Take 1 tablet (10 mg total) by mouth daily with breakfast. 9 tablet 0   valACYclovir  (VALTREX ) 1000 MG tablet Take 1,000 mg by mouth daily. Continuously     Fluticasone Propionate (XHANCE) 93 MCG/ACT EXHU 1-2 sprays per nostril twice a day. 16 mL 5   No current facility-administered medications for this visit.   Allergies: No Known Allergies I reviewed his past medical history, social history, family history, and environmental history and no significant changes have been reported from his previous visit.  Review of Systems  Constitutional:  Negative for appetite change, chills, fever and unexpected weight change.  HENT:  Positive for congestion. Negative for rhinorrhea.   Eyes:  Negative for itching.  Respiratory:  Negative for cough, chest tightness, shortness of breath and wheezing.   Cardiovascular:  Negative for chest pain.  Gastrointestinal:   Negative for abdominal pain.  Genitourinary:  Negative for difficulty urinating.  Skin:  Negative for rash.  Allergic/Immunologic: Positive for environmental allergies.  Neurological:  Negative for headaches.    Objective: BP 116/74 (BP Location: Left Arm, Patient Position: Sitting, Cuff Size: Large)   Pulse 91   Temp (!) 97.5 F (36.4 C) (Temporal)   Resp 18   Ht 6' (1.829 m)   Wt 274 lb 12.8 oz (124.6 kg)   SpO2 94%   BMI 37.27 kg/m  Body mass index is 37.27 kg/m. Physical Exam Vitals and nursing note reviewed.  Constitutional:      Appearance: Normal appearance. He is well-developed.  HENT:     Head: Normocephalic and atraumatic.     Right Ear: Tympanic membrane and external ear normal.     Left Ear: Tympanic membrane and external ear normal.     Nose: Congestion present.     Mouth/Throat:     Mouth: Mucous  membranes are moist.     Pharynx: Oropharynx is clear.  Eyes:     Conjunctiva/sclera: Conjunctivae normal.  Cardiovascular:     Rate and Rhythm: Normal rate and regular rhythm.     Heart sounds: Normal heart sounds. No murmur heard.    No friction rub. No gallop.  Pulmonary:     Effort: Pulmonary effort is normal.     Breath sounds: Normal breath sounds. No wheezing, rhonchi or rales.  Musculoskeletal:     Cervical back: Neck supple.  Skin:    General: Skin is warm.     Findings: No rash.  Neurological:     Mental Status: He is alert and oriented to person, place, and time.  Psychiatric:        Behavior: Behavior normal.    Previous notes and tests were reviewed. The plan was reviewed with the patient/family, and all questions/concerned were addressed.  It was my pleasure to see Octaviano today and participate in his care. Please feel free to contact me with any questions or concerns.  Sincerely,  Orlan Cramp, DO Allergy  & Immunology  [facility]  [place] office: [phone] [facility] office: [phone]

## 2024-06-20 NOTE — Telephone Encounter (Signed)
 Please refer to ENT for chronic nasal congestion.  Thank you.

## 2024-06-20 NOTE — Telephone Encounter (Signed)
 Martin [name] has been internally referred to [facility].  They will reach out to the patient to schedule.  I will follow up in a week.

## 2024-07-04 NOTE — Telephone Encounter (Signed)
 Patient called requesting update on the ENT referral that was placed on 06/20/2024. Nat had placed the referral to [facility] @ [phone]. Patient was given phone number due to it being over 5 business days. Patient thanked.

## 2024-07-11 NOTE — Telephone Encounter (Signed)
 Martin [name] is scheduled with Dr. Masciello for 11/10 at 9:00 am.

## 2024-07-17 ENCOUNTER — Ambulatory Visit (INDEPENDENT_AMBULATORY_CARE_PROVIDER_SITE_OTHER)

## 2024-07-17 ENCOUNTER — Encounter (INDEPENDENT_AMBULATORY_CARE_PROVIDER_SITE_OTHER): Payer: Self-pay

## 2024-07-17 VITALS — BP 139/65 | HR 34 | Ht 72.0 in | Wt 265.0 lb

## 2024-07-17 DIAGNOSIS — R001 Bradycardia, unspecified: Secondary | ICD-10-CM | POA: Diagnosis not present

## 2024-07-17 DIAGNOSIS — J343 Hypertrophy of nasal turbinates: Secondary | ICD-10-CM | POA: Diagnosis not present

## 2024-07-17 DIAGNOSIS — J342 Deviated nasal septum: Secondary | ICD-10-CM | POA: Diagnosis not present

## 2024-07-17 DIAGNOSIS — J3489 Other specified disorders of nose and nasal sinuses: Secondary | ICD-10-CM | POA: Diagnosis not present

## 2024-07-17 MED ORDER — FLUTICASONE PROPIONATE 50 MCG/ACT NA SUSP
2.0000 | Freq: Every day | NASAL | 6 refills | Status: AC
Start: 2024-07-17 — End: ?

## 2024-07-17 NOTE — Progress Notes (Addendum)
 Dear Dr. Luke, Here is my assessment for our mutual patient, Martin [name]. Thank you for allowing me the opportunity to care for your patient. Please do not hesitate to contact me should you have any other questions. Sincerely, Dr. Hadassah Parody  Otolaryngology Clinic Note Referring provider: Dr. Luke HPI:   Initial HPI (07/17/2024) Discussed the use of AI scribe software for clinical note transcription with the patient, who gave verbal consent to proceed.  History of Present Illness Martin [name] is a 53 year old male who presents with chronic nasal obstruction  Nasal obstruction - Chronic nasal obstruction for 3-5 years and worsening  - Obstruction described as a 'steel barrier' at night - Alternates sides depending on sleeping position - sense of smell not altered   Facial pressure and pain - Facial pressure and pain present - Symptoms have improved over the last week  Prior treatments for nasal symptoms - Previously used Afrin nasal spray up to three times daily, discontinued one month ago - Flonase was ineffective but has not used this daily - only intermittently and expected to provide similar relief as Afrin  - Not using any nasal sprays regularly at present - Using Zyrtec currently    Independent Review of Additional Tests or Records:  Referral note 06/20/24 Orlan Luke, DO: difficulty breathing through nose, sometimes being totally closed up. Treatments include Afrin ,Claritin, Zyrtec, Cetirizine. Uses saline rinses. Started on xhance   09/11/22 ANA w/Reflex: negative   Note [facility] 06/18/2021: medical therapy optimization; consider septoplasty ITR in future   PMH/Meds/All/SocHx/FamHx/ROS:   Past Medical History:  Diagnosis Date   HSV (herpes simplex virus) infection    Hyperlipidemia    Hypertension    Urticaria      Past Surgical History:  Procedure Laterality Date   ANKLE SURGERY  02/14/2008   Right Leg/Right Ankle   APPENDECTOMY     EAR CYST  EXCISION N/A 04/17/2013   Procedure: CYST REMOVAL ON SCALP X2, BACK;  Surgeon: Vicenta DELENA Poli, MD;  Location: [facility] OR;  Service: General;  Laterality: N/A;  head and back   KNEE SURGERY Left 07/02/2022    Family History  Problem Relation Age of Onset   Allergic rhinitis Mother      Social Connections: Not on file     Current Outpatient Medications  Medication Instructions   amLODipine (NORVASC) 10 mg, Daily at bedtime   fluticasone (FLONASE) 50 MCG/ACT nasal spray 2 sprays, Each Nare, Daily   levocetirizine (XYZAL) 5 mg, Oral, Every evening   metoprolol  succinate (TOPROL  XL) 25 mg, Oral, Daily   predniSONE  (DELTASONE ) 10 mg, Oral, Daily with breakfast   valACYclovir  (VALTREX ) 1,000 mg, Daily     Physical Exam:   BP 139/65 (BP Location: Left Arm, Patient Position: Sitting)   Pulse (!) 34   Ht 6' (1.829 m)   Wt 265 lb (120.2 kg)   SpO2 95%   BMI 35.94 kg/m   Salient findings:  CN II-XII intact Bilateral EAC clear and TM intact with well pneumatized middle ear spaces Anterior rhinoscopy: Septum rightward deviation anteriorly and leftward posteriorly; bilateral inferior turbinates with significant hypertrophy  No lesions of oral cavity/oropharynx No obviously palpable neck masses/lymphadenopathy/thyromegaly No respiratory distress or stridor  Seprately Identifiable Procedures:  Prior to initiating any procedures, risks/benefits/alternatives were explained to the patient and verbal consent obtained. None  Impression & Plans:  Martin [name] is a 53 y.o. male with   1. Nasal obstruction   2. Hypertrophy of both  inferior nasal turbinates   3. Nasal septal deviation   4. 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 Current nasal spray Darral) is expensive and not consistently effective. Flonase has been  tried without significant improvement but was only used intermittently. Surgical intervention discussed as a potential solution, involving septoplasty and turbinate reduction. He is considering surgery but will trial Flonase first given that he has not used this daily before.  - Prescribed Flonase for daily use for two weeks. - Scheduled follow-up appointment in two weeks to assess response to Flonase. - Discussed potential septoplasty and turbinate reduction surgery if symptoms persist. - Advised against Afrin use  - Educated on proper nasal spray technique, aiming laterally   Bradycardia Pt heart rate today was in 50s but intermittently dropping to 30s and high 20s. He was asymptomatic the entire time. I told him to continue monitoring at home (has a BP monitor that monitors HR) and call his PCP if persistent. He is on beta blocker.     See below regarding exact medications prescribed this encounter including dosages and route: Meds ordered this encounter  Medications   fluticasone (FLONASE) 50 MCG/ACT nasal spray    Sig: Place 2 sprays into both nostrils daily.    Dispense:  16 g    Refill:  6      Thank you for allowing me the opportunity to care for your patient. Please do not hesitate to contact me should you have any other questions.  Sincerely, Hadassah Parody, MD Otolaryngologist (ENT), [facility] Phone: [phone] Fax: [phone]

## 2024-07-24 ENCOUNTER — Ambulatory Visit

## 2024-07-24 ENCOUNTER — Ambulatory Visit: Attending: Physician Assistant | Admitting: Physician Assistant

## 2024-07-24 ENCOUNTER — Encounter: Payer: Self-pay | Admitting: Physician Assistant

## 2024-07-24 VITALS — BP 128/80 | HR 80 | Resp 16 | Ht 72.0 in | Wt 269.8 lb

## 2024-07-24 DIAGNOSIS — I10 Essential (primary) hypertension: Secondary | ICD-10-CM

## 2024-07-24 DIAGNOSIS — R002 Palpitations: Secondary | ICD-10-CM

## 2024-07-24 DIAGNOSIS — E876 Hypokalemia: Secondary | ICD-10-CM

## 2024-07-24 DIAGNOSIS — I517 Cardiomegaly: Secondary | ICD-10-CM | POA: Diagnosis not present

## 2024-07-24 DIAGNOSIS — I493 Ventricular premature depolarization: Secondary | ICD-10-CM

## 2024-07-24 DIAGNOSIS — E785 Hyperlipidemia, unspecified: Secondary | ICD-10-CM

## 2024-07-24 DIAGNOSIS — G4730 Sleep apnea, unspecified: Secondary | ICD-10-CM

## 2024-07-24 NOTE — Progress Notes (Addendum)
 Cardiology Office Note    Date:  07/24/2024  ID:  Martin [name], DOB August 07, 1971, MRN [id number] PCP:  Leigh Lung, MD  Cardiologist:  Previously Dr. Dewane when she was with Dr. Leisa Electrophysiologist:  None   Chief Complaint: abnormal heart rate  History of Present Illness: .    Martin [name] is a 53 y.o. male with visit-pertinent history of HTN, HLD, frequent PVCs, LVH with dilated left ventricle, HSV, tobacco abuse seen for f/u. He saw Dr. Dewane initially in 2022 for palpitations and short PR interval. Monitor 2022 showed NSR, 1 4 beat run AT, frequent 14.4% PVCs, avg HR 90bpm, range 47-162bpm, rare PACs. Do not see specific commentary regarding frequency of PVCs.  ETT 09/2022 was normal, unable to review tracings. Echo 09/2022 showed EF 60-65%, dilated LVH, moderate LVH. Was started on metoprolol  in 10/2022, lost to follow-up thereafter.  He recently saw ENT 07/17/24 who noted HR 34 on intake VS, no access to EKG at that time. He was asymptomatic at that time. He then reports he saw PCP last Thursday who did an EKG, said things looked off/possibly high heart rate. He states they recommended to hold amlodipine and start metoprolol  which he noticed was a different formulation of tartrate. He took 1/2 tablet 11/13-11/16 and 1 whole tablet this AM, HR presently 80s with frequent PVCs. He has noticed some vague sensation of lightheadedness and dyspnea on exertion the last month but this has been mild. He states it was more the recent abnormal observations by outside offices prompting updated OV to help clarify the plan for his medications. We do not have a copy of the PCP OV from 11/13. No chest pain, syncope, edema. No true orthopnea though on one occasion has woken up with an irregular breathing pattern. BP over the weekend has been normal. Reports hx of snoring.  No significant family hx of cardiac disease or SCD.  Labwork independently reviewed: 10/2022 K 4.6, Cr 0.73 09/2022  TSH wnl, LFTS ok, CBC wnl 2023 LDL 102, trig 102  ROS: .    Please see the history of present illness.  All other systems are reviewed and otherwise negative.  Studies Reviewed: SABRA    EKG:  EKG is ordered today, personally reviewed, demonstrating:  First tracing 15:09 showed NSR with frequent PVCs but had too much baseline wander in avR, avL, avF so asked MA to repeat.  EKG Interpretation Date/Time:  Monday July 24 2024 15:33:18 EST Ventricular Rate:  70 PR Interval:  128 QRS Duration:  96 QT Interval:  380 QTC Calculation: 410 R Axis:   15  Text Interpretation: Sinus rhythm with occasional Premature ventricular complexes No acute ST/T changes Confirmed by Brunella Wileman [phone]) on 07/24/2024 3:35:04 PM  I discussed with Dr. Inocencio, not felt to represent true delta wave.   CV Studies: Cardiac studies reviewed are outlined and summarized above. Otherwise please see EMR for full report.   Current Reported Medications:.    Current Meds  Medication Sig   fluticasone  (FLONASE ) 50 MCG/ACT nasal spray Place 2 sprays into both nostrils daily.   levocetirizine (XYZAL ) 5 MG tablet Take 1 tablet (5 mg total) by mouth every evening.   metoprolol  succinate (TOPROL  XL) 25 MG 24 hr tablet Take 1 tablet (25 mg total) by mouth daily. PT states taking metoprolol  TARTRATE not succinate per recent PCP OV   olmesartan-hydrochlorothiazide (BENICAR HCT) 40-25 MG tablet Take 1 tablet by mouth daily.   valACYclovir  (VALTREX ) 1000 MG tablet  Take 1,000 mg by mouth daily. Continuously    Physical Exam:    VS:  BP 128/80 (BP Location: Right Arm, Patient Position: Sitting, Cuff Size: Large)   Pulse 80   Resp 16   Ht 6' (1.829 m)   Wt 269 lb 12.8 oz (122.4 kg)   SpO2 97%   BMI 36.59 kg/m    Wt Readings from Last 3 Encounters:  07/24/24 269 lb 12.8 oz (122.4 kg)  07/17/24 265 lb (120.2 kg)  06/20/24 274 lb 12.8 oz (124.6 kg)    GEN: Well nourished, well developed in no acute distress NECK:  No JVD; No carotid bruits CARDIAC: RRR with frequent ectopy, no murmurs, rubs, gallops RESPIRATORY:  Clear to auscultation without rales, wheezing or rhonchi  ABDOMEN: Soft, non-tender, non-distended EXTREMITIES:  No edema; No acute deformity   Asessement and Plan:.    1. Palpitations, frequent PVCs - he was recently noted to have HR in the 20s-30s at ENT office last week but was asymptomatic at the time. 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 I suspect he is continuing to have a fairly high burden of ectopy. He would prefer long acting metoprolol  over short acting. We will switch him from Lopressor  to Toprol  25mg  daily. We will obtain a 14 day live Zio monitor to quantify his PVCs and also ensure there is no true hemodynamically significant bradycardia or heart block contributing to low pulse rate. I also did review his EKG with Dr. Inocencio who noted the abnormality between P/QRS but does not feel this represents a true delta wave. If PVC burden remains significant and bradycardia is excluded, would benefit from further adjustment of antihypertensive regimen to optimize beta blocker therapy +/- formal referral to EP. Check CMET, CBC, TSH + free T4, Mg today. I also asked CMA to help request copy of recent PCP OV.  2. Left ventricular hypertrophy and dilated left ventricle - noted by echocardiogram in 2024. In the setting of frequent PVCs, cardiac MRI would be helpful to follow up these findings as well as evaluate for infiltrative disease. Will arrange. No clear s/sx of CHF on exam today.  3. Essential HTN - follow BP on current regimen. Continue to defer amlodipine for now in lieu of olmesartan/hydrochlorothiazide 40/25mg  and Toprol  25mg  daily.  4. HLD - defer mgmt to  PCP.  5. Sleep disordered breathing - STOPBANG 8. Will order [facility] sleep study. Addendum: patient has seen [facility] and had sleep study in 08/2022 showing moderate OSA advised to start CPAP but lost to f/u thereafter. Advised to contact their office to return to discuss.    Disposition: F/u with me in 6 weeks.  Signed, Adream Parzych N Aysen Shieh, PA-C

## 2024-07-24 NOTE — Patient Instructions (Addendum)
 Medication Instructions:  STOP Norvasc (amlodipine) *If you need a refill on your cardiac medications before your next appointment, please call your pharmacy*  Lab Work: TODAY-CBC, CMET, MAG & TSH W/ FREE T4 If you have labs (blood work) drawn today and your tests are completely normal, you will receive your results only by: MyChart Message (if you have MyChart) OR A paper copy in the mail If you have any lab test that is abnormal or we need to change your treatment, we will call you to review the results.  Testing/Procedures: Martin [name]- Long Term Monitor Instructions  Your physician has requested you wear a ZIO patch monitor for 14 days.  This is a single patch monitor. [employer] supplies one patch monitor per enrollment. Additional stickers are not available. Please do not apply patch if you will be having a Nuclear Stress Test,  Echocardiogram, Cardiac CT, MRI, or Chest Xray during the period you would be wearing the  monitor. The patch cannot be worn during these tests. You cannot remove and re-apply the  ZIO XT patch monitor.  Your ZIO patch monitor will be mailed 3 day USPS to your address on file. It may take 3-5 days  to receive your monitor after you have been enrolled.  Once you have received your monitor, please review the enclosed instructions. Your monitor  has already been registered assigning a specific monitor serial # to you.  Billing and Patient Assistance Program Information  We have supplied [employer] with any of your insurance information on file for billing purposes. [employer] offers a sliding scale Patient Assistance Program for patients that do not have  insurance, or whose insurance does not completely cover the cost of the ZIO monitor.  You must apply for the Patient Assistance Program to qualify for this discounted rate.  To apply, please call [employer] at [phone], select option 4, select option 2, ask to apply for  Patient Assistance Program. Meredeth will ask  your household income, and how many people  are in your household. They will quote your out-of-pocket cost based on that information.  [employer] will also be able to set up a 89-month, interest-free payment plan if needed.  Applying the monitor   Shave hair from upper left chest.  Hold abrader disc by orange tab. Rub abrader in 40 strokes over the upper left chest as  indicated in your monitor instructions.  Clean area with 4 enclosed alcohol pads. Let dry.  Apply patch as indicated in monitor instructions. Patch will be placed under collarbone on left  side of chest with arrow pointing upward.  Rub patch adhesive wings for 2 minutes. Remove white label marked 1. Remove the white  label marked 2. Rub patch adhesive wings for 2 additional minutes.  While looking in a mirror, press and release button in center of patch. A small green light will  flash 3-4 times. This will be your only indicator that the monitor has been turned on.  Do not shower for the first 24 hours. You may shower after the first 24 hours.  Press the button if you feel a symptom. You will hear a small click. Record Date, Time and  Symptom in the Patient Logbook.  When you are ready to remove the patch, follow instructions on the last 2 pages of Patient  Logbook. Stick patch monitor onto the last page of Patient Logbook.  Place Patient Logbook in the blue and white box. Use locking tab on box and tape box closed  securely.  The blue and white box has prepaid postage on it. Please place it in the mailbox as  soon as possible. Your physician should have your test results approximately 7 days after the  monitor has been mailed back to [facility].  Call [facility] Customer Care at [phone] if you have questions regarding  your ZIO XT patch monitor. Call them immediately if you see an orange light blinking on your  monitor.  If your monitor falls off in less than 4 days, contact our Monitor department at  [phone].  If your monitor becomes loose or falls off after 4 days call [employer] at [phone] for  suggestions on securing your monitor   Follow-Up: At [facility], you and your health needs are our priority.  As part of our continuing mission to provide you with exceptional heart care, our providers are all part of one team.  This team includes your primary Cardiologist (physician) and Advanced Practice Providers or APPs (Physician Assistants and Nurse Practitioners) who all work together to provide you with the care you need, when you need it.  Your next appointment:   5-6 week(s)  Provider:   Raphael Bring, PA  We recommend signing up for the patient portal called MyChart.  Sign up information is provided on this After Visit Summary.  MyChart is used to connect with patients for Virtual Visits (Telemedicine).  Patients are able to view lab/test results, encounter notes, upcoming appointments, etc.  Non-urgent messages can be sent to your provider as well.   To learn more about what you can do with MyChart, go to [email or web address].   Other Instructions   You are scheduled for Cardiac MRI at the location below.  Please arrive for your appointment at ______________ . ?  [facility] [address] [place], [place] [address] Please take advantage of the free valet parking available at the [facility] and [employer] (Entrance C).  Proceed to the [facility] Radiology Department (First Floor) for check-in.    Magnetic resonance imaging (MRI) is a painless test that produces images of the inside of the body without using Xrays.  During an MRI, strong magnets and radio waves work together in a data processing manager to form detailed images.   MRI images may provide more details about a medical condition than X-rays, CT scans, and ultrasounds can provide.  You may be given earphones to listen for instructions.  You may eat a light breakfast and take  medications as ordered with the exception of furosemide, hydrochlorothiazide, chlorthalidone or spironolactone  (or any other fluid pill). If you are undergoing a stress MRI, please avoid stimulants for 12 hr prior to test. (I.e. Caffeine, nicotine, chocolate, or antihistamine medications)  If your provider has ordered anti-anxiety medications for this test, then you will need a driver.  An IV will be inserted into one of your veins. Contrast material will be injected into your IV. It will leave your body through your urine within a day. You may be told to drink plenty of fluids to help flush the contrast material out of your system.  You will be asked to remove all metal, including: Watch, jewelry, and other metal objects including hearing aids, hair pieces and dentures. Also wearable glucose monitoring systems (ie. Freestyle Libre and Omnipods) (Braces and fillings normally are not a problem.)   TEST WILL TAKE APPROXIMATELY 1 HOUR  PLEASE NOTIFY SCHEDULING AT LEAST 24 HOURS IN ADVANCE IF YOU ARE UNABLE TO KEEP YOUR APPOINTMENT. [phone]  For more  information and frequently asked questions, please visit our website : [email or web address]  Please call the Cardiac Imaging Nurse Navigators with any questions/concerns. [phone] Office

## 2024-07-24 NOTE — Progress Notes (Unsigned)
Enrolled for [employer] to mail a ZIO XT long term holter monitor to the patients address on file.   Dr. Jacinto Halim to read.

## 2024-07-25 ENCOUNTER — Ambulatory Visit: Payer: Self-pay | Admitting: Physician Assistant

## 2024-07-25 ENCOUNTER — Ambulatory Visit: Attending: Physician Assistant

## 2024-07-25 ENCOUNTER — Other Ambulatory Visit: Payer: Self-pay | Admitting: *Deleted

## 2024-07-25 DIAGNOSIS — R42 Dizziness and giddiness: Secondary | ICD-10-CM

## 2024-07-25 DIAGNOSIS — R002 Palpitations: Secondary | ICD-10-CM

## 2024-07-25 DIAGNOSIS — R001 Bradycardia, unspecified: Secondary | ICD-10-CM

## 2024-07-25 DIAGNOSIS — I517 Cardiomegaly: Secondary | ICD-10-CM

## 2024-07-25 DIAGNOSIS — I493 Ventricular premature depolarization: Secondary | ICD-10-CM

## 2024-07-25 LAB — COMPREHENSIVE METABOLIC PANEL WITH GFR
ALT: 16 IU/L (ref 0–44)
AST: 15 IU/L (ref 0–40)
Albumin: 4.4 g/dL (ref 3.8–4.9)
Alkaline Phosphatase: 81 IU/L (ref 47–123)
BUN/Creatinine Ratio: 17 (ref 9–20)
BUN: 17 mg/dL (ref 6–24)
Bilirubin Total: 0.3 mg/dL (ref 0.0–1.2)
CO2: 26 mmol/L (ref 20–29)
Calcium: 9.9 mg/dL (ref 8.7–10.2)
Chloride: 105 mmol/L (ref 96–106)
Creatinine, Ser: 1.02 mg/dL (ref 0.76–1.27)
Globulin, Total: 2.9 g/dL (ref 1.5–4.5)
Glucose: 91 mg/dL (ref 70–99)
Potassium: 4.6 mmol/L (ref 3.5–5.2)
Sodium: 143 mmol/L (ref 134–144)
Total Protein: 7.3 g/dL (ref 6.0–8.5)
eGFR: 88 mL/min/1.73 (ref >59)

## 2024-07-25 LAB — CBC
Hematocrit: 45.3 % (ref 37.5–51.0)
Hemoglobin: 14.9 g/dL (ref 13.0–17.7)
MCH: 28.8 pg (ref 26.6–33.0)
MCHC: 32.9 g/dL (ref 31.5–35.7)
MCV: 88 fL (ref 79–97)
Platelets: 285 x10E3/uL (ref 150–450)
RBC: 5.17 x10E6/uL (ref 4.14–5.80)
RDW: 13.4 % (ref 11.6–15.4)
WBC: 6.8 x10E3/uL (ref 3.4–10.8)

## 2024-07-25 LAB — MAGNESIUM: Magnesium: 2.3 mg/dL (ref 1.6–2.3)

## 2024-07-25 LAB — TSH+FREE T4
Free T4: 1.41 ng/dL (ref 0.82–1.77)
TSH: 0.913 u[IU]/mL (ref 0.450–4.500)

## 2024-07-25 NOTE — Progress Notes (Unsigned)
 ZIO AT was requested in lieu of the ZIO XT requested 07/24/24.  Order for ZIO XT cancelled, Dayna Dunn to inform patient to mail back unused in box with prepaid postage. New order for ZIO AT entered. Enrolled for [employer] to mail a ZIO AT Live Telemetry monitor to patients address on file.   Dr. Ladona to read.

## 2024-07-27 MED ORDER — METOPROLOL SUCCINATE ER 25 MG PO TB24: 25.0000 mg | ORAL_TABLET | Freq: Every day | ORAL | 1 refills | Status: DC

## 2024-07-31 ENCOUNTER — Encounter (HOSPITAL_COMMUNITY): Payer: Self-pay

## 2024-08-02 ENCOUNTER — Emergency Department (HOSPITAL_BASED_OUTPATIENT_CLINIC_OR_DEPARTMENT_OTHER)
Admission: EM | Admit: 2024-08-02 | Discharge: 2024-08-02 | Disposition: A | Discharge status: Home / Self Care | Attending: Emergency Medicine | Admitting: Emergency Medicine

## 2024-08-02 ENCOUNTER — Ambulatory Visit (HOSPITAL_BASED_OUTPATIENT_CLINIC_OR_DEPARTMENT_OTHER)
Admission: RE | Admit: 2024-08-02 | Discharge: 2024-08-02 | Disposition: A | Discharge status: Home / Self Care | Source: Ambulatory Visit | Attending: Physician Assistant | Admitting: Physician Assistant

## 2024-08-02 ENCOUNTER — Other Ambulatory Visit: Payer: Self-pay

## 2024-08-02 ENCOUNTER — Other Ambulatory Visit: Payer: Self-pay | Admitting: Physician Assistant

## 2024-08-02 ENCOUNTER — Emergency Department (HOSPITAL_BASED_OUTPATIENT_CLINIC_OR_DEPARTMENT_OTHER): Admitting: Radiology

## 2024-08-02 ENCOUNTER — Encounter (HOSPITAL_BASED_OUTPATIENT_CLINIC_OR_DEPARTMENT_OTHER): Payer: Self-pay

## 2024-08-02 DIAGNOSIS — I10 Essential (primary) hypertension: Secondary | ICD-10-CM

## 2024-08-02 DIAGNOSIS — I517 Cardiomegaly: Secondary | ICD-10-CM

## 2024-08-02 DIAGNOSIS — I493 Ventricular premature depolarization: Secondary | ICD-10-CM

## 2024-08-02 DIAGNOSIS — E785 Hyperlipidemia, unspecified: Secondary | ICD-10-CM

## 2024-08-02 DIAGNOSIS — R002 Palpitations: Secondary | ICD-10-CM

## 2024-08-02 DIAGNOSIS — Z79899 Other long term (current) drug therapy: Secondary | ICD-10-CM | POA: Insufficient documentation

## 2024-08-02 DIAGNOSIS — G4730 Sleep apnea, unspecified: Secondary | ICD-10-CM

## 2024-08-02 DIAGNOSIS — R0602 Shortness of breath: Secondary | ICD-10-CM | POA: Insufficient documentation

## 2024-08-02 DIAGNOSIS — I159 Secondary hypertension, unspecified: Secondary | ICD-10-CM | POA: Insufficient documentation

## 2024-08-02 DIAGNOSIS — R42 Dizziness and giddiness: Secondary | ICD-10-CM | POA: Diagnosis present

## 2024-08-02 LAB — CBC
HCT: 43.9 % (ref 39.0–52.0)
Hemoglobin: 15.1 g/dL (ref 13.0–17.0)
MCH: 29.7 pg (ref 26.0–34.0)
MCHC: 34.4 g/dL (ref 30.0–36.0)
MCV: 86.2 fL (ref 80.0–100.0)
Platelets: 248 K/uL (ref 150–400)
RBC: 5.09 MIL/uL (ref 4.22–5.81)
RDW: 14.3 % (ref 11.5–15.5)
WBC: 8.5 K/uL (ref 4.0–10.5)
nRBC: 0 % (ref 0.0–0.2)

## 2024-08-02 LAB — BASIC METABOLIC PANEL WITH GFR
Anion gap: 14 (ref 5–15)
BUN: 17 mg/dL (ref 6–20)
CO2: 23 mmol/L (ref 22–32)
Calcium: 9.5 mg/dL (ref 8.9–10.3)
Chloride: 101 mmol/L (ref 98–111)
Creatinine, Ser: 0.91 mg/dL (ref 0.61–1.24)
GFR, Estimated: >60 mL/min (ref >60)
Glucose, Bld: 94 mg/dL (ref 70–99)
Potassium: 3.4 mmol/L — ABNORMAL LOW (ref 3.5–5.1)
Sodium: 138 mmol/L (ref 135–145)

## 2024-08-02 LAB — TROPONIN T, HIGH SENSITIVITY: Troponin T High Sensitivity: <15 ng/L (ref 0–19)

## 2024-08-02 LAB — D-DIMER, QUANTITATIVE: D-Dimer, Quant: 0.37 ug{FEU}/mL (ref 0.00–0.50)

## 2024-08-02 MED ORDER — GADOBUTROL 1 MMOL/ML IV SOLN
15.0000 mL | Freq: Once | INTRAVENOUS | Status: AC | PRN
  Administered 2024-08-02: 15 mL via INTRAVENOUS

## 2024-08-02 NOTE — ED Provider Notes (Signed)
  EMERGENCY DEPARTMENT AT [facility] Provider Note   CSN: [id number] Arrival date & time: 08/02/24  2114     Patient presents with: Hypertension (/) and Shortness of Breath   Martin [name] is a 53 y.o. male.  With a history of hypertension and lung nodule who presents to ED for hypertension and shortness of breath.  Patient reports ongoing shortness of breath for the last month.  Repeatedly elevated blood pressure readings at home today with associated lightheadedness.  Cardiac MRI was completed earlier today and patient does not yet have the results of this study.  No chest pain at this time.  No nausea vomiting fevers chills recent illness.  No prior history of VTE.     Hypertension Associated symptoms include shortness of breath.  Shortness of Breath      Prior to Admission medications   Medication Sig Start Date End Date Taking? Authorizing Provider  fluticasone  (FLONASE ) 50 MCG/ACT nasal spray Place 2 sprays into both nostrils daily. 07/17/24   Masciello, Maria C, MD  levocetirizine (XYZAL ) 5 MG tablet Take 1 tablet (5 mg total) by mouth every evening. 06/20/24   Luke Orlan HERO, DO  metoprolol  succinate (TOPROL  XL) 25 MG 24 hr tablet Take 1 tablet (25 mg total) by mouth daily. 07/27/24   Dunn, Dayna N, PA-C  olmesartan-hydrochlorothiazide (BENICAR HCT) 40-25 MG tablet Take 1 tablet by mouth daily. 04/26/24   [provider]  valACYclovir  (VALTREX ) 1000 MG tablet Take 1,000 mg by mouth daily. Continuously    [provider]    Allergies: Patient has no known allergies.    Review of Systems  Respiratory:  Positive for shortness of breath.     Updated Vital Signs BP (!) 140/82   Pulse 80   Temp 97.7 F (36.5 C) (Oral)   Resp (!) 8   Ht 6' (1.829 m)   Wt 120.2 kg   SpO2 95%   BMI 35.94 kg/m   Physical Exam Vitals and nursing note reviewed.  Constitutional:      Comments: Anxious  HENT:     Head: Normocephalic and atraumatic.   Eyes:     Pupils: Pupils are equal, round, and reactive to light.  Cardiovascular:     Rate and Rhythm: Normal rate and regular rhythm.  Pulmonary:     Effort: Pulmonary effort is normal.     Breath sounds: Normal breath sounds.  Abdominal:     Palpations: Abdomen is soft.     Tenderness: There is no abdominal tenderness.  Musculoskeletal:     Right lower leg: No edema.     Left lower leg: No edema.  Skin:    General: Skin is warm and dry.  Neurological:     Mental Status: He is alert.  Psychiatric:        Mood and Affect: Mood normal.     (all labs ordered are listed, but only abnormal results are displayed) Labs Reviewed  BASIC METABOLIC PANEL WITH GFR - Abnormal; Notable for the following components:      Result Value   Potassium 3.4 (*)    All other components within normal limits  CBC  D-DIMER, QUANTITATIVE  TROPONIN T, HIGH SENSITIVITY  TROPONIN T, HIGH SENSITIVITY    EKG: EKG Interpretation Date/Time:  Wednesday August 02 2024 21:25:56 EST Ventricular Rate:  82 PR Interval:  122 QRS Duration:  92 QT Interval:  364 QTC Calculation: 425 R Axis:   19  Text Interpretation: Normal sinus  rhythm Normal ECG When compared with ECG of 24-Jul-2024 15:33, Premature ventricular complexes are no longer Present Confirmed by Pamella Sharper [id number]) on 08/02/2024 9:51:02 PM  Radiology: DG Chest 2 View Result Date: 08/02/2024 EXAM: 2 VIEW(S) XRAY OF THE CHEST 08/02/2024 09:40:00 PM COMPARISON: Low-dose nodule follow up chest CT no contrast 04/24/2024. CLINICAL HISTORY: SOB. FINDINGS: LUNGS AND PLEURA: Eventration and chronic asymmetric elevation of the anterior right hemidiaphragm is again shown. There is decreased inspiration with increased perihilar and left basilar linear atelectasis. No focal pneumonia or pleural effusion is seen. There is a stable 1.5 cm lingular nodule, also stable back to chest CT from February 2019. No pneumothorax. HEART AND MEDIASTINUM: The  cardiomediastinal silhouette and vascular markings are normal. BONES AND SOFT TISSUES: There is thoracic spondylosis and bridging enthesopathy. IMPRESSION: 1. No acute cardiopulmonary process identified. 2. Stable 1.5 cm lingular pulmonary nodule, unchanged since 2019. 3. Eventration and chronic asymmetric elevation of the anterior right hemidiaphragm. Electronically signed by: Francis Quam MD 08/02/2024 09:52 PM EST RP Workstation: [id number]     Procedures   Medications Ordered in the ED - No data to display                                  Medical Decision Making 53 year old male with history as above sent to the ED for hypertension shortness of breath lightheadedness.  Underwent cardiac MRI today with results pending.  No active chest pain on my assessment.  No suspicion for ACS as no ischemic changes on EKG and negative troponin.  D-dimer is negative.  Low suspicion for PE.  Remainder of laboratory workup looks okay.  Patient was checking his blood pressure repeatedly at home with high systolic blood pressure of 190s.  Now down to 140s systolic blood pressure after resting here.  He certainly seems quite anxious about cardiac MRI today and I instructed him to follow-up with his results and PCP.  Appropriate for discharge at this time.  Return precautions discussed with patient in detail.  Amount and/or Complexity of Data Reviewed Labs: ordered. Radiology: ordered.        Final diagnoses:  Secondary hypertension    ED Discharge Orders     None          Pamella Sharper LABOR, DO 08/02/24 2330

## 2024-08-02 NOTE — ED Triage Notes (Signed)
 Pt reports SOB and HTN. Pt reports SOB x1 month. Pt reports having an MRI today at cardiology.

## 2024-08-02 NOTE — Discharge Instructions (Signed)
 You were seen in the Emergency Department for high blood pressure shortness of breath and lightheadedness Your blood work EKG and chest x-ray looked okay There is no evidence of heart attack or blood clot in your lungs Remainder of your labs look normal We are not certain what is causing your symptoms Is important they follow-up on the results of your cardiac MRI and talk to your primary doctor Continue taking all previous Medications Return to the emerged department for severe chest pain severe headaches or any other concerns

## 2024-08-07 ENCOUNTER — Telehealth: Payer: Self-pay | Admitting: Physician Assistant

## 2024-08-07 ENCOUNTER — Other Ambulatory Visit: Payer: Self-pay

## 2024-08-07 DIAGNOSIS — Z87891 Personal history of nicotine dependence: Secondary | ICD-10-CM

## 2024-08-07 DIAGNOSIS — F17210 Nicotine dependence, cigarettes, uncomplicated: Secondary | ICD-10-CM

## 2024-08-07 DIAGNOSIS — I493 Ventricular premature depolarization: Secondary | ICD-10-CM

## 2024-08-07 DIAGNOSIS — Z122 Encounter for screening for malignant neoplasm of respiratory organs: Secondary | ICD-10-CM

## 2024-08-07 DIAGNOSIS — E785 Hyperlipidemia, unspecified: Secondary | ICD-10-CM

## 2024-08-07 DIAGNOSIS — E876 Hypokalemia: Secondary | ICD-10-CM

## 2024-08-07 DIAGNOSIS — I429 Cardiomyopathy, unspecified: Secondary | ICD-10-CM

## 2024-08-07 DIAGNOSIS — R0602 Shortness of breath: Secondary | ICD-10-CM

## 2024-08-07 MED ORDER — METOPROLOL TARTRATE 100 MG PO TABS: 100.0000 mg | ORAL_TABLET | ORAL | 0 refills | Status: DC

## 2024-08-07 NOTE — Addendum Note (Signed)
 Addended by: Chinita Schimpf N on: 08/07/2024 03:23 PM   Modules accepted: Orders

## 2024-08-07 NOTE — Telephone Encounter (Signed)
 I called and spoke with patient about his MRI results. He is also aware of his lung nodule which is being followed by pulmonology. He has not yet started wearing monitor, encouraged to wear the AT version since he was mailed XT and AT. Patient also says he has ENT follow-up coming up soon to discuss possible surgery; we discussed that cardiac workup is needed before proceeding.   Triage, please help with the following:  - arrange coronary CT for cardiomyopathy evaluation, OK for Lopressor  100mg  x 1 day of scan in place of his Toprol  25mg  - please make sure this is done on the scanner in our [facility] office as this is better for patients with PVCs. - refer to electrophysiology for frequent PVCs, any EP MD - please schedule with general cardiology MD on one of those [facility] hold slots in about 3 weeks for follow-up. Patient was formerly seen by Dr. Custovic with [facility] so can be with any of our MDs  OK to send correspondence/instructions/appt info back to the patient by MyChart per his request. Thanks.

## 2024-08-07 NOTE — Addendum Note (Signed)
 Addended by: Roberto Hlavaty N on: 08/07/2024 03:33 PM   Modules accepted: Orders

## 2024-08-07 NOTE — Telephone Encounter (Signed)
 Coronary CTA ordered - instructions sent to patient via MyChart Metoprolol  tartrate 100mg  x1 dose ordered EP referral ordered >> staff message to scheduling team

## 2024-08-08 ENCOUNTER — Ambulatory Visit: Payer: Self-pay | Admitting: Physician Assistant

## 2024-08-08 LAB — BASIC METABOLIC PANEL WITH GFR
BUN/Creatinine Ratio: 13 (ref 9–20)
BUN: 10 mg/dL (ref 6–24)
CO2: 23 mmol/L (ref 20–29)
Calcium: 9.4 mg/dL (ref 8.7–10.2)
Chloride: 101 mmol/L (ref 96–106)
Creatinine, Ser: 0.79 mg/dL (ref 0.76–1.27)
Glucose: 119 mg/dL — ABNORMAL HIGH (ref 70–99)
Potassium: 4.1 mmol/L (ref 3.5–5.2)
Sodium: 139 mmol/L (ref 134–144)
eGFR: 106 mL/min/1.73 (ref >59)

## 2024-08-08 LAB — PRO B NATRIURETIC PEPTIDE: NT-Pro BNP: <36 pg/mL (ref 0–121)

## 2024-08-09 ENCOUNTER — Ambulatory Visit (INDEPENDENT_AMBULATORY_CARE_PROVIDER_SITE_OTHER)

## 2024-08-09 ENCOUNTER — Encounter (INDEPENDENT_AMBULATORY_CARE_PROVIDER_SITE_OTHER): Payer: Self-pay

## 2024-08-09 VITALS — HR 98

## 2024-08-09 DIAGNOSIS — J342 Deviated nasal septum: Secondary | ICD-10-CM | POA: Diagnosis not present

## 2024-08-09 DIAGNOSIS — J351 Hypertrophy of tonsils: Secondary | ICD-10-CM | POA: Diagnosis not present

## 2024-08-09 DIAGNOSIS — J3489 Other specified disorders of nose and nasal sinuses: Secondary | ICD-10-CM

## 2024-08-09 DIAGNOSIS — J328 Other chronic sinusitis: Secondary | ICD-10-CM | POA: Diagnosis not present

## 2024-08-09 DIAGNOSIS — J343 Hypertrophy of nasal turbinates: Secondary | ICD-10-CM

## 2024-08-09 NOTE — Progress Notes (Unsigned)
 Dear Dr. Leigh, Here is my assessment for our mutual patient, Martin [name]. Thank you for allowing me the opportunity to care for your patient. Please do not hesitate to contact me should you have any other questions. Sincerely, Dr. Hadassah Parody  Otolaryngology Clinic Note Referring provider: Dr. Leigh HPI:   Initial HPI (07/17/24) Discussed the use of AI scribe software for clinical note transcription with the patient, who gave verbal consent to proceed.  Martin [name] is a 53 year old male who presents with chronic nasal obstruction  Nasal obstruction - Chronic nasal obstruction for 3-5 years and worsening  - Obstruction described as a 'steel barrier' at night - Alternates sides depending on sleeping position - sense of smell not altered   Facial pressure and pain - Facial pressure and pain present - Symptoms have improved over the last week  Prior treatments for nasal symptoms - Previously used Afrin nasal spray up to three times daily, discontinued one month ago - Flonase  was ineffective but has not used this daily - only intermittently and expected to provide similar relief as Afrin  - Not using any nasal sprays regularly at present - Using Zyrtec currently   History of Present Illness --------------------------------------------------------- 08/09/2024   Martin [name] is a 53 year old male who presents for follow-up regarding chronic nasal congestion   Since he was last seen, he trialed Flonase  but did not perceive significant benefit.  He has not been using Afrin since his last visit.  Feeling like this is having a significant impact on his quality of life.  He is interested in surgical correction.    Of note, since patient was last seen, he is having some lightheadedness and disequilibrium.  When he was last seen in my office, his heart rate was dropping into the 40s. He has an ongoing cardiology evaluation and will not be able to undergo any surgical intervention  until he is cleared from a cardiac standpoint.  Tonsillar enlargement - Primary care physician identified enlarged tonsils - No symptoms attributable to tonsillar enlargement   Independent Review of Additional Tests or Records:  Referral note 06/20/24 Orlan Cramp, DO: difficulty breathing through nose, sometimes being totally closed up. Treatments include Afrin ,Claritin, Zyrtec, Cetirizine. Uses saline rinses. Started on xhance    09/11/22 ANA w/Reflex: negative   Note [facility] 06/18/2021: medical therapy optimization; consider septoplasty ITR in future   PMH/Meds/All/SocHx/FamHx/ROS:   Past Medical History:  Diagnosis Date   HSV (herpes simplex virus) infection    Hyperlipidemia    Hypertension    Urticaria      Past Surgical History:  Procedure Laterality Date   ANKLE SURGERY  02/14/2008   Right Leg/Right Ankle   APPENDECTOMY     EAR CYST EXCISION N/A 04/17/2013   Procedure: CYST REMOVAL ON SCALP X2, BACK;  Surgeon: Vicenta DELENA Poli, MD;  Location: [facility] OR;  Service: General;  Laterality: N/A;  head and back   KNEE SURGERY Left 07/02/2022    Family History  Problem Relation Age of Onset   Allergic rhinitis Mother      Social Connections: Not on file     Current Outpatient Medications  Medication Instructions   fluticasone  (FLONASE ) 50 MCG/ACT nasal spray 2 sprays, Each Nare, Daily   levocetirizine (XYZAL ) 5 mg, Oral, Every evening   metoprolol  succinate (TOPROL  XL) 25 mg, Oral, Daily   metoprolol  tartrate (LOPRESSOR ) 100 mg, Oral, As directed, Take TWO HOURS before CT test.   sacubitril-valsartan (ENTRESTO) 97-103  MG 1 tablet, Oral, 2 times daily   valACYclovir  (VALTREX ) 1,000 mg, Daily     Physical Exam:   Pulse 98   SpO2 96%   Salient findings:  CN II-XII intact Bilateral EAC clear and TM intact with well pneumatized middle ear spaces Anterior rhinoscopy: Septum rightward deviation anteriorly and leftward posteriorly; bilateral inferior turbinates with  significant hypertrophy  No lesions of oral cavity/oropharynx; bilateral 2-3+ tonsils No obviously palpable neck masses/lymphadenopathy/thyromegaly No respiratory distress or stridor  Seprately Identifiable Procedures:  Prior to initiating any procedures, risks/benefits/alternatives were explained to the patient and verbal consent obtained. None  Impression & Plans:  Martin [name] is a 53 y.o. male with   1. Nasal obstruction   2. Other chronic sinusitis   3. Tonsillar hypertrophy   4. Nasal septal deviation   5. 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 Flonase  has been ineffective. Surgical intervention is considered to address the anatomical issues and improve nasal airflow. Surgery will not alleviate lightheadedness or balance issues.  Cardiac clearance is necessary before scheduling surgery due to the need for general anesthesia.  We also discussed his previous CT scan from several years ago.  Given that his symptoms have worsened and he is concerned that he could have chronic sinus problems we can repeat a CT scan and ensure no sinus disease is present. -Plan for septoplasty and turbinate reduction surgery contingent on cardiac clearance. - Ordered CT scan to evaluate for any additional sinus issues. - Instructed to avoid using Afrin - Instructed to call the office once cardiac workup is complete to discuss his CT scan and schedule surgery.  Tonsillar enlargement - Asymptomatic -No concerns today.  Provided reassurance  I personally spent a total of 35 minutes in the care of the patient today including preparing to see the patient, getting/reviewing separately obtained history, and counseling  and educating.  See below regarding exact medications prescribed this encounter including dosages and route: No orders of the defined types were placed in this encounter.   Thank you for allowing me the opportunity to care for your patient. Please do not hesitate to contact me should you have any other questions.  Sincerely, Hadassah Parody, MD Otolaryngologist (ENT), [facility] Phone: [phone] Fax: [phone]

## 2024-08-10 ENCOUNTER — Telehealth: Payer: Self-pay | Admitting: Pharmacy Technician

## 2024-08-10 MED ORDER — SACUBITRIL-VALSARTAN 97-103 MG PO TABS: 1.0000 | ORAL_TABLET | Freq: Two times a day (BID) | ORAL | 6 refills | Status: DC

## 2024-08-10 NOTE — Telephone Encounter (Signed)
 Spoke with pt, aware of the recommendations. New script sent to the pharmacy and Lab orders mailed to the pt

## 2024-08-10 NOTE — Telephone Encounter (Signed)
 Pharmacy Patient Advocate Encounter  Received notification from [facility] that Prior Authorization for sacubitril/valsartan has been APPROVED from 08/10/24 to 08/10/25   PA #/Case ID/Reference #: [id number]

## 2024-08-10 NOTE — Telephone Encounter (Signed)
    Pharmacy Patient Advocate Encounter   Received notification from CoverMyMeds that prior authorization for sacubitril/valsartan is required/requested.   Insurance verification completed.   The patient is insured through [facility].   Per test claim: PA required; PA submitted to above mentioned insurance via Latent Key/confirmation #/EOC [id number] Status is pending

## 2024-08-17 ENCOUNTER — Ambulatory Visit: Admitting: Cardiology

## 2024-08-17 ENCOUNTER — Ambulatory Visit: Admitting: Allergy

## 2024-08-24 ENCOUNTER — Ambulatory Visit (HOSPITAL_COMMUNITY)
Admission: RE | Admit: 2024-08-24 | Discharge: 2024-08-24 | Attending: Physician Assistant | Admitting: Physician Assistant

## 2024-08-24 DIAGNOSIS — I429 Cardiomyopathy, unspecified: Secondary | ICD-10-CM | POA: Insufficient documentation

## 2024-08-24 MED ORDER — IOHEXOL 350 MG/ML SOLN
100.0000 mL | Freq: Once | INTRAVENOUS | Status: AC | PRN
  Administered 2024-08-24: 16:00:00 100 mL via INTRAVENOUS

## 2024-08-24 MED ORDER — NITROGLYCERIN 0.4 MG SL SUBL
0.8000 mg | SUBLINGUAL_TABLET | Freq: Once | SUBLINGUAL | Status: AC
  Administered 2024-08-24: 16:00:00 0.8 mg via SUBLINGUAL

## 2024-09-01 ENCOUNTER — Ambulatory Visit

## 2024-09-01 VITALS — BP 124/76 | HR 88 | Ht 72.0 in | Wt 272.0 lb

## 2024-09-01 DIAGNOSIS — I428 Other cardiomyopathies: Secondary | ICD-10-CM

## 2024-09-01 DIAGNOSIS — I493 Ventricular premature depolarization: Secondary | ICD-10-CM | POA: Diagnosis not present

## 2024-09-01 DIAGNOSIS — E785 Hyperlipidemia, unspecified: Secondary | ICD-10-CM | POA: Diagnosis not present

## 2024-09-01 DIAGNOSIS — G4733 Obstructive sleep apnea (adult) (pediatric): Secondary | ICD-10-CM | POA: Diagnosis not present

## 2024-09-01 MED ORDER — EMPAGLIFLOZIN 10 MG PO TABS: 10.0000 mg | ORAL_TABLET | Freq: Every day | ORAL | 3 refills | Status: AC

## 2024-09-01 MED ORDER — METOPROLOL SUCCINATE ER 50 MG PO TB24: 50.0000 mg | ORAL_TABLET | Freq: Every day | ORAL | 3 refills | Status: DC

## 2024-09-01 MED ORDER — SPIRONOLACTONE 25 MG PO TABS: 25.0000 mg | ORAL_TABLET | Freq: Every day | ORAL | 3 refills | Status: DC

## 2024-09-01 NOTE — Progress Notes (Signed)
 "     Cardiology Office Note Date:  09/01/2024  ID:  Martin [name], DOB 04/15/1971, MRN [id number] PCP:  Leigh Lung, MD  Cardiologist:  Joelle VEAR Ren Donley, MD  Chief Complaint  Patient presents with   Cardiomyopathy     Problems LV dysfunction/NICM CCTA 12/25: CAC 0 cMR 11/25: EF 35%, no LGE High PVC burden ETT 1/24- normal per report TTE 1/24: 60-65%, mod LVH, dilated LV OSA not on CPAP M: XL25, SV 97-103  Visits  11/25 w/ Dayna Dunn: PVCs --> Toprol  25, 14 day monitor, cMR for LVH 11/25 ED: High BP (190), dyspnea, and lightheadedness 12/25: switched O-HTZ to Entresto  97-103 (labs including LP), coronary CTA given low EF, EP referral; awaiting EM 12:25: Remind to do lipid panel, increased XL to 50, EN10, SE25, Labs in 2 weeks, EM pending,    Discussed the use of AI scribe software for clinical note transcription with the patient, who gave verbal consent to proceed.  History of Present Illness   Martin [name] is a 53 year old male presenting for follow up. He is on metoprolol  25 mg. Since starting it, he has continued to have intermittent, brief, non-sustained chest pain two to three times daily, occurring with movement or at rest. He has stable shortness of breath and no leg swelling, orthopnea, or PND.  He was concerned at the idea of being on GDMT for the rest of his life and wondered if his cardiomyopathy can be addressed with just managing the PVCs.      ROS: Please see the history of present illness. All other systems are reviewed and negative.   PHYSICAL EXAM: VS:  BP 124/76 (BP Location: Right Arm, Patient Position: Sitting, Cuff Size: Large)   Pulse 88 Comment: Irregularly irregular  Ht 6' (1.829 m)   Wt 272 lb (123.4 kg)   SpO2 96%   BMI 36.89 kg/m  , BMI Body mass index is 36.89 kg/m. GEN: Well nourished, well developed, in no acute distress HEENT: normal Neck: no JVD, carotid bruits, or masses Cardiac: RRR; no murmurs, rubs, or gallops,no edema   Respiratory:  CTAB bilaterally, normal work of breathing GI: soft, nontender, nondistended, + BS Extremities: No LE edema Skin: warm and dry, no rash Neuro:  Strength and sensation are intact  Recent Labs: Reviewed  Studies: Reviewed  ASSESSMENT AND PLAN: Martin [name] is a 53 y.o. male who presents for follow up    Non-ischemic cardiomyopathy Reduced heart function likely due to frequent PVCs. No obstructive CAD on CT - Increased metoprolol  to 50 mg daily. - Started Jardiance . - Started spironolactone . - Ordered blood work in two weeks to monitor kidney function.  Frequent ventricular premature depolarizations (PVCs) Frequent PVCs reducing heart function. Symptoms include chest pain and shortness of breath. Referral to electrophysiologist for further evaluation. Metoprolol  increase may reduce PVCs and improve symptoms. - Increased metoprolol  to 50 mg daily. - Referred to electrophysiologist for further evaluation and management. - Await results of heart monitor to assess PVC frequency and impact.  Obstructive sleep apnea Suspected obstructive sleep apnea contributing to shortness of breath. Sleep study pending. - Schedule sleep study to evaluate for obstructive sleep apnea.  Pre-op for ENT surgery Patient can walk up a flight of stairs and had recent CT without evidence of obstructive CAD. - No additional evaluation needed prior to his ENT surgery      Signed, Joelle VEAR Ren Donley, MD  09/01/2024 9:48 AM    [facility] "

## 2024-09-01 NOTE — Patient Instructions (Signed)
 Medication Instructions:  Increase Toprol  XL to 50 mg once daily Start Jardiance  10 mg once daily Start Spironolactone  25 mg once daily *If you need a refill on your cardiac medications before your next appointment, please call your pharmacy*  Lab Work: BMP and Magnesium in 2 weeks at [facility] If you have labs (blood work) drawn today and your tests are completely normal, you will receive your results only by: MyChart Message (if you have MyChart) OR A paper copy in the mail If you have any lab test that is abnormal or we need to change your treatment, we will call you to review the results.  Testing/Procedures: NONE  Follow-Up: At [facility], you and your health needs are our priority.  As part of our continuing mission to provide you with exceptional heart care, our providers are all part of one team.  This team includes your primary Cardiologist (physician) and Advanced Practice Providers or APPs (Physician Assistants and Nurse Practitioners) who all work together to provide you with the care you need, when you need it.  Your next appointment:   3 month(s)  Provider:   Dunn, PA-C  We recommend signing up for the patient portal called MyChart.  Sign up information is provided on this After Visit Summary.  MyChart is used to connect with patients for Virtual Visits (Telemedicine).  Patients are able to view lab/test results, encounter notes, upcoming appointments, etc.  Non-urgent messages can be sent to your provider as well.   To learn more about what you can do with MyChart, go to [email or web address].

## 2024-09-05 ENCOUNTER — Ambulatory Visit (HOSPITAL_BASED_OUTPATIENT_CLINIC_OR_DEPARTMENT_OTHER): Payer: Self-pay | Admitting: Family

## 2024-09-05 DIAGNOSIS — I493 Ventricular premature depolarization: Secondary | ICD-10-CM | POA: Diagnosis not present

## 2024-09-05 DIAGNOSIS — R002 Palpitations: Secondary | ICD-10-CM

## 2024-09-05 DIAGNOSIS — I517 Cardiomegaly: Secondary | ICD-10-CM | POA: Diagnosis not present

## 2024-09-05 DIAGNOSIS — R001 Bradycardia, unspecified: Secondary | ICD-10-CM | POA: Diagnosis not present

## 2024-09-05 DIAGNOSIS — R42 Dizziness and giddiness: Secondary | ICD-10-CM

## 2024-09-05 NOTE — Progress Notes (Unsigned)
 " Electrophysiology Office Note:   Date:  09/06/2024  ID:  Martin [name], DOB Aug 26, 1971, MRN [id number]  Primary Cardiologist: None Primary Heart Failure: None Electrophysiologist: None      History of Present Illness:   Martin [name] is a 53 y.o. 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   He saw his primary physician in 2022 for palpitations.  He wore a cardiac monitor that showed 14.4% PVC burden.  Ejection fraction was normal at that time.  He again saw ENT and was noted to have a heart rate of 34 on initial vitals.  This was 07/17/2024.  He was asymptomatic at the time.  He subsequently saw his primary physician who started him on metoprolol .  He recently wore a cardiac monitor with a 20% PVC burden.  Discussed the use of AI scribe software for clinical note transcription with the patient, who gave verbal consent to proceed.  History of Present Illness Martin [name] is a 53 year old male with premature ventricular contractions who presents with lightheadedness and elevated blood pressure. He was referred by Dayna Dunn for evaluation of early heartbeats.  He has been experiencing lightheadedness and elevated blood pressure since a recent change in his medication. He needs to eat in the morning to stabilize himself, whereas previously he could go until noon without eating. He describes feeling 'out of it' upon waking, akin to an 'out of body' experience, and has experienced dizziness and fatigue, which have improved over time. No current shortness of breath, but he reports previous episodes of fatigue and dizziness, which have improved.  He has premature ventricular contractions (PVCs), with a monitor showing these early heartbeats occurring about 20% of the time. He describes the sensation as his heart being 'out of rhythm'  or 'skipping a beat'. An MRI indicated that his heart function is significantly reduced, with an ejection fraction of about 35%, compared to the normal range of 55-65%. This reduced heart function has contributed to his symptoms of tiredness and fatigue.    Review of systems complete and found to be negative unless listed in HPI.   EP Information / Studies Reviewed:    EKG is ordered today. Personal review as below.        Risk Assessment/Calculations:       STOP-Bang Score:  8       Physical Exam:   VS:  BP (!) 154/100 (BP Location: Right Arm, Patient Position: Sitting, Cuff Size: Large)   Pulse 77   Ht 6' (1.829 m)   Wt 272 lb 3.2 oz (123.5 kg)   SpO2 95%   BMI 36.92 kg/m    Wt Readings from Last 3 Encounters:  09/06/24 272 lb 3.2 oz (123.5 kg)  09/01/24 272 lb (123.4 kg)  08/02/24 265 lb (120.2 kg)     GEN: Well nourished, well developed in no acute distress NECK: No JVD; No carotid bruits CARDIAC: Regular rate and rhythm, no murmurs, rubs, gallops RESPIRATORY:  Clear to auscultation without rales, wheezing or rhonchi  ABDOMEN: Soft, non-tender, non-distended EXTREMITIES:  No edema; No deformity   ASSESSMENT AND PLAN:    1.  PVCs: 20% burden on cardiac monitor, personally reviewed.  Ejection fraction 35% by cardiac MRI.  He is not having PVCs today.  In addition, he does not have any scar on his MRI.  He would benefit from PVC suppression due to his reduced ejection fraction.  We discussed ablation versus medication management.  Risks and benefits of ablation were discussed.  At this point, he would like to try medications first.  Daniela Siebers start mexiletine 250 mg twice daily.  Dvid Pendry have him return for in 2 weeks for an EKG.  If he is doing well on this medication without PVCs, would likely plan for repeat monitor.  If he continues to have PVCs, could potentially switch to flecainide if he wants to continue to avoid ablation.  If he wants ablation at that point, this would be  reasonable.  2.  Chronic systolic heart failure: Due to nonischemic cardiomyopathy.  On medical therapy per primary cardiology.  Emmabelle Fear stop metoprolol  and start carvedilol 12.5 mg twice daily due to elevated blood pressures.  3.  Obstructive sleep apnea: Sleep study scheduled  Follow up with EP APP in 4 weeks  Signed, Dex Blakely Gladis Norton, MD  "

## 2024-09-06 ENCOUNTER — Encounter: Payer: Self-pay | Admitting: Cardiology

## 2024-09-06 ENCOUNTER — Ambulatory Visit: Attending: Cardiology | Admitting: Cardiology

## 2024-09-06 VITALS — BP 154/100 | HR 77 | Ht 72.0 in | Wt 272.2 lb

## 2024-09-06 DIAGNOSIS — I5022 Chronic systolic (congestive) heart failure: Secondary | ICD-10-CM

## 2024-09-06 DIAGNOSIS — R002 Palpitations: Secondary | ICD-10-CM

## 2024-09-06 DIAGNOSIS — R001 Bradycardia, unspecified: Secondary | ICD-10-CM | POA: Diagnosis not present

## 2024-09-06 DIAGNOSIS — I493 Ventricular premature depolarization: Secondary | ICD-10-CM

## 2024-09-06 DIAGNOSIS — G4733 Obstructive sleep apnea (adult) (pediatric): Secondary | ICD-10-CM | POA: Diagnosis not present

## 2024-09-06 MED ORDER — MEXILETINE HCL 250 MG PO CAPS: 250.0000 mg | ORAL_CAPSULE | Freq: Two times a day (BID) | ORAL | 1 refills | Status: AC

## 2024-09-06 MED ORDER — CARVEDILOL 12.5 MG PO TABS: 12.5000 mg | ORAL_TABLET | Freq: Two times a day (BID) | ORAL | 1 refills | Status: AC

## 2024-09-06 NOTE — Patient Instructions (Signed)
 Medication Instructions:  Your physician has recommended you make the following change in your medication:   ** Stop Metoprolol   ** Begin Carvedilol 12.5mg  - 1 tablet by mouth twice daily.  ** Begin Mexiletine 250mg  - 1 capsule by mouth twice daily.   *If you need a refill on your cardiac medications before your next appointment, please call your pharmacy*  Lab Work: None ordered.  If you have labs (blood work) drawn today and your tests are completely normal, you will receive your results only by: MyChart Message (if you have MyChart) OR A paper copy in the mail If you have any lab test that is abnormal or we need to change your treatment, we will call you to review the results.  Testing/Procedures: None ordered.   Follow-Up: At [facility], you and your health needs are our priority.  As part of our continuing mission to provide you with exceptional heart care, our providers are all part of one team.  This team includes your primary Cardiologist (physician) and Advanced Practice Providers or APPs (Physician Assistants and Nurse Practitioners) who all work together to provide you with the care you need, when you need it.  Your next appointment:    As scheduled   We recommend signing up for the patient portal called MyChart.  Sign up information is provided on this After Visit Summary.  MyChart is used to connect with patients for Virtual Visits (Telemedicine).  Patients are able to view lab/test results, encounter notes, upcoming appointments, etc.  Non-urgent messages can be sent to your provider as well.   To learn more about what you can do with MyChart, go to [email or web address].

## 2024-09-15 LAB — BASIC METABOLIC PANEL WITH GFR
BUN/Creatinine Ratio: 16 (ref 9–20)
BUN: 14 mg/dL (ref 6–24)
CO2: 25 mmol/L (ref 20–29)
Calcium: 9.2 mg/dL (ref 8.7–10.2)
Chloride: 101 mmol/L (ref 96–106)
Creatinine, Ser: 0.9 mg/dL (ref 0.76–1.27)
Glucose: 67 mg/dL — ABNORMAL LOW (ref 70–99)
Potassium: 4.2 mmol/L (ref 3.5–5.2)
Sodium: 137 mmol/L (ref 134–144)
eGFR: 101 mL/min/1.73

## 2024-09-15 LAB — MAGNESIUM: Magnesium: 2 mg/dL (ref 1.6–2.3)

## 2024-09-21 ENCOUNTER — Ambulatory Visit

## 2024-09-21 VITALS — BP 154/94 | HR 69 | Ht 72.0 in | Wt 276.5 lb

## 2024-09-21 DIAGNOSIS — I493 Ventricular premature depolarization: Secondary | ICD-10-CM

## 2024-09-21 NOTE — Patient Instructions (Addendum)
 Medication Instructions:  Your physician recommends that you continue on your current medications as directed. Please refer to the Current Medication list given to you today.  *If you need a refill on your cardiac medications before your next appointment, please call your pharmacy*  Lab Work: None ordered.  If you have labs (blood work) drawn today and your tests are completely normal, you will receive your results only by: MyChart Message (if you have MyChart) OR A paper copy in the mail If you have any lab test that is abnormal or we need to change your treatment, we will call you to review the results.  Testing/Procedures: None ordered.   Follow-Up: At [facility], you and your health needs are our priority.  As part of our continuing mission to provide you with exceptional heart care, our providers are all part of one team.  This team includes your primary Cardiologist (physician) and Advanced Practice Providers or APPs (Physician Assistants and Nurse Practitioners) who all work together to provide you with the care you need, when you need it.  Your next appointment:   09/21/2024 at 2pm with Dr Ren

## 2024-09-21 NOTE — Progress Notes (Signed)
" ° °  Nurse Visit   Date of Encounter: 09/21/2024 ID: RIVER AMBROSIO, DOB [birth year], MRN [id number]  PCP:  Leigh Lung, MD   [facility] Providers Cardiologist:  Joelle VEAR Ren Donley, MD Electrophysiologist:  Soyla Gladis Norton, MD      Visit Details   VS:  BP (!) 154/94   Pulse 69   Ht 6' (1.829 m)   Wt 276 lb 8 oz (125.4 kg)   SpO2 95%   BMI 37.50 kg/m  , BMI Body mass index is 37.5 kg/m.  Wt Readings from Last 3 Encounters:  09/21/24 276 lb 8 oz (125.4 kg)  09/06/24 272 lb 3.2 oz (123.5 kg)  09/01/24 272 lb (123.4 kg)     Reason for visit: EKG for PVC's - Mexiletine start Performed today: Vitals, EKG and consulted with Dr Norton Changes (medications, testing, etc.) : none Length of Visit: 25 minutes    Medications Adjustments/Labs and Tests Ordered: Orders Placed This Encounter  Procedures   EKG 12-Lead   No orders of the defined types were placed in this encounter. EKG completed and reviewed by Dr Norton.  No PVC's present on EKG.  Pt advised to continue Mexiletine as prescribed.  Pt reports continued elevated BP and dizziness.  Was seen yesterday by his PCP with BP of 170/100.  He states she wanted to make changes but felt it would be better for cardiology to make adjustments since pt was being seen today.  Pt reports he has not started Jardiance  yet and needs to pick up medication from the pharmacy.  Pt is uncertain if he is taking Spironolactone  as it does not sound familiar to him. Dr Norton suggest pt's BP be further evaluated by Dr Ren or APP as soon as possible.  Appointment scheduled for today 09/21/2024 at 2pm with Dr Ren.  Pt verbalizes understanding and agrees with current plan.  Signed, Aldona FORBES Marina, RN  09/21/2024 11:46 AM  "

## 2024-09-21 NOTE — Progress Notes (Unsigned)
"   °  °  Cardiology Office Note Date:  09/21/2024  ID:  Martin [name], DOB 1971/07/13, MRN [id number] PCP:  Leigh Lung, MD  Cardiologist:  Joelle VEAR Ren Donley, MD  No chief complaint on file.    Problems ***  Visits  ***    Discussed the use of AI scribe software for clinical note transcription with the patient, who gave verbal consent to proceed.  History of Present Illness     ROS: Please see the history of present illness. All other systems are reviewed and negative.   Past Medical History:  Diagnosis Date   HSV (herpes simplex virus) infection    Hyperlipidemia    Hypertension    Urticaria     Past Surgical History:  Procedure Laterality Date   ANKLE SURGERY  02/14/2008   Right Leg/Right Ankle   APPENDECTOMY     EAR CYST EXCISION N/A 04/17/2013   Procedure: CYST REMOVAL ON SCALP X2, BACK;  Surgeon: Vicenta DELENA Poli, MD;  Location: [facility] OR;  Service: General;  Laterality: N/A;  head and back   KNEE SURGERY Left 07/02/2022    Current Outpatient Medications  Medication Sig Dispense Refill   carvedilol  (COREG ) 12.5 MG tablet Take 1 tablet (12.5 mg total) by mouth 2 (two) times daily. 180 tablet 1   empagliflozin  (JARDIANCE ) 10 MG TABS tablet Take 1 tablet (10 mg total) by mouth daily before breakfast. (Patient not taking: Reported on 09/21/2024) 90 tablet 3   fluticasone  (FLONASE ) 50 MCG/ACT nasal spray Place 2 sprays into both nostrils daily. 16 g 6   levocetirizine (XYZAL ) 5 MG tablet Take 1 tablet (5 mg total) by mouth every evening. (Patient not taking: Reported on 09/21/2024) 30 tablet 3   loratadine (CLARITIN) 10 MG tablet Take 10 mg by mouth daily.     mexiletine (MEXITIL) 250 MG capsule Take 1 capsule (250 mg total) by mouth 2 (two) times daily. 180 capsule 1   sacubitril -valsartan  (ENTRESTO ) 97-103 MG Take 1 tablet by mouth 2 (two) times daily. 60 tablet 6   spironolactone  (ALDACTONE ) 25 MG tablet Take 1 tablet (25 mg total) by mouth daily. 90 tablet 3    valACYclovir  (VALTREX ) 1000 MG tablet Take 1,000 mg by mouth daily. Continuously     No current facility-administered medications for this visit.    Allergies:   Patient has no known allergies.   Social History:  see above  Family History:  see above  PHYSICAL EXAM: VS:  There were no vitals taken for this visit. , BMI There is no height or weight on file to calculate BMI. GEN: Well nourished, well developed, in no acute distress HEENT: normal Neck: no JVD, carotid bruits, or masses Cardiac: ***RRR; no murmurs, rubs, or gallops,no edema  Respiratory:  CTAB bilaterally, normal work of breathing GI: soft, nontender, nondistended, + BS Extremities: No LE edema Skin: warm and dry, no rash Neuro:  Strength and sensation are intact  EKG: ***  Recent Labs: Reviewed  Studies: Reviewed  Assessment and Plan Assessment & Plan       Signed, Joelle VEAR Ren Donley, MD  09/21/2024 12:24 PM    [facility] "

## 2024-09-25 NOTE — Progress Notes (Unsigned)
 "      Cardiology Office Note Date:  09/26/2024  ID:  Martin [name], DOB [birth year], MRN [id number] PCP:  Martin Lung, Martin [name]  Cardiologist:  Martin VEAR Ren Donley, Martin [name]  Chief Complaint  Patient presents with   Cardiomyopathy      Problems LV dysfunction/NICM CCTA 12/25: CAC 0 cMR 11/25: EF 35%, no LGE High PVC burden ETT 1/24- normal per report TTE 1/24: 60-65%, mod LVH, dilated LV EM 12/25: 20% PVC burden OSA not on CPAP M: CL12.5, EN10, ME250BID, SV 97-103, SE25   Visits  11/25 w/ Martin [name]: PVCs --> Toprol  25, 14 day monitor, cMR for LVH 11/25 ED: High BP (190), dyspnea, and lightheadedness 12/25: switched O-HTZ to Entresto  97-103 (labs including LP), coronary CTA given low EF, EP referral; awaiting EM 12:25: Remind to do lipid panel, increased XL to 50, EN10, SE25, Labs in 2 weeks, EM pending EP 12/25: started mexiletine and plan to repeat monitor; patient preferred meds to ablation; metop to coreg  due to BP Clinical Support: ECG w/o PVCs, had not picked up Jardiance  and unclear whether patient is taking spironolactone  1/26: LP, repeat renin aldo, follow up in 1 month for TTE      ROS: Otherwise negative Discussed the use of AI scribe software for clinical note transcription with the patient, who gave verbal consent to proceed.  History of Present Illness Martin [name] is a 54 year old male with hypertension and heart failure who presents with feeling unwell and lightheadedness. He has been experiencing significant lightheadedness, tingling in his hands and fingers, and difficulty focusing, which have impacted his ability to work and drive. He feels like he might pass out at times and describes his condition as 'feeling like crap' and 'like a zombie', with difficulty functioning normally. The patient reports that he does not have shortness of breath. There has been a recent change in his medication regimen. He stopped taking carvedilol  last week and mexiletine yesterday,  while continuing with Entresto  and spironolactone . He had forgotten to take spironolactone  for a period but resumed it last Thursday. He is concerned that the medications might be causing his symptoms, as he was not experiencing these issues before his diagnosis. He checks his blood pressure frequently, about ten times a day, using a wrist monitor, and feels anxious about his health, expressing concerns about his well-being. His current medications include Entresto  and spironolactone , with recent discontinuation of carvedilol  and mexiletine. He is frustrated with his current state, noting that he was taking all medications initially but began to wean off them due to not feeling well. He is concerned about the impact of his symptoms on his ability to work and function daily.   Physical Exam VS:  BP (!) 164/99 (BP Location: Left Arm, Patient Position: Sitting)   Pulse 100   Ht 6' (1.829 m)   Wt 270 lb 3.2 oz (122.6 kg)   SpO2 96%   BMI 36.65 kg/m  , BMI Body mass index is 36.65 kg/m. GEN: Well nourished, well developed, in no acute distress HEENT: normal Neck: no JVD, carotid bruits, or masses Cardiac: RRR; no murmurs, rubs, or gallops,no edema  Respiratory:  CTAB bilaterally, normal work of breathing GI: soft, nontender, nondistended, + BS Extremities: No LE edema Skin: warm and dry, no rash Neuro:  Strength and sensation are intact  Recent Labs: Reviewed  Assessment and Plan Assessment & Plan Chronic systolic heart failure Symptoms may be related to heart failure or medication side effects.  Emphasized managing blood pressure and heart function to prevent deterioration. - Continue carvedilol , Entresto , and spironolactone . - Ordered EKG to assess for PVCs- No evidence of PVCs. - Repeat echocardiogram in one month to assess heart function. - Provided blood pressure log and arm blood pressure cuff for monitoring. - Instructed to check blood pressure once daily in the morning and report  readings in one week.  Frequent ventricular premature depolarizations Current medications may help manage PVCs and improve heart function. - Ordered EKG to assess for PVCs- No PVCs  Essential hypertension Hypertension not well-controlled. Blood pressure management crucial due to weak heart function. Current medications aid in heart failure management. - Continue carvedilol , Entresto , and spironolactone . - Provided arm blood pressure cuff for accurate monitoring. - Instructed to check blood pressure once daily in the morning and report readings in one week. - Will adjust medication regimen based on blood pressure readings and symptoms.   Signed, Martin VEAR Ren Donley, Martin [name]  09/26/2024 10:02 AM    [facility] "

## 2024-09-26 ENCOUNTER — Ambulatory Visit

## 2024-09-26 ENCOUNTER — Ambulatory Visit: Payer: Self-pay | Admitting: Pulmonary Disease

## 2024-09-26 ENCOUNTER — Telehealth (INDEPENDENT_AMBULATORY_CARE_PROVIDER_SITE_OTHER): Payer: Self-pay

## 2024-09-26 ENCOUNTER — Encounter: Payer: Self-pay | Admitting: Pulmonary Disease

## 2024-09-26 ENCOUNTER — Ambulatory Visit: Admitting: Pulmonary Disease

## 2024-09-26 VITALS — BP 164/99 | HR 100 | Ht 72.0 in | Wt 270.2 lb

## 2024-09-26 VITALS — BP 142/83 | HR 83 | Ht 72.0 in | Wt 272.0 lb

## 2024-09-26 DIAGNOSIS — R918 Other nonspecific abnormal finding of lung field: Secondary | ICD-10-CM

## 2024-09-26 DIAGNOSIS — I5022 Chronic systolic (congestive) heart failure: Secondary | ICD-10-CM

## 2024-09-26 DIAGNOSIS — F17210 Nicotine dependence, cigarettes, uncomplicated: Secondary | ICD-10-CM | POA: Diagnosis not present

## 2024-09-26 DIAGNOSIS — J431 Panlobular emphysema: Secondary | ICD-10-CM

## 2024-09-26 DIAGNOSIS — Z87891 Personal history of nicotine dependence: Secondary | ICD-10-CM

## 2024-09-26 DIAGNOSIS — E785 Hyperlipidemia, unspecified: Secondary | ICD-10-CM

## 2024-09-26 DIAGNOSIS — I493 Ventricular premature depolarization: Secondary | ICD-10-CM | POA: Diagnosis not present

## 2024-09-26 DIAGNOSIS — I10 Essential (primary) hypertension: Secondary | ICD-10-CM

## 2024-09-26 DIAGNOSIS — G4733 Obstructive sleep apnea (adult) (pediatric): Secondary | ICD-10-CM

## 2024-09-26 DIAGNOSIS — J439 Emphysema, unspecified: Secondary | ICD-10-CM

## 2024-09-26 DIAGNOSIS — M549 Dorsalgia, unspecified: Secondary | ICD-10-CM | POA: Diagnosis not present

## 2024-09-26 DIAGNOSIS — R0789 Other chest pain: Secondary | ICD-10-CM

## 2024-09-26 MED ORDER — OMRON 3 SERIES BP MONITOR DEVI: 0 refills | Status: AC

## 2024-09-26 NOTE — Patient Instructions (Signed)
 Medication Instructions:  Your physician recommends that you continue on your current medications as directed. Please refer to the Current Medication list given to you today.  *If you need a refill on your cardiac medications before your next appointment, please call your pharmacy*  Lab Work: Today- Lipids, Renin/Aldo If you have labs (blood work) drawn today and your tests are completely normal, you will receive your results only by: MyChart Message (if you have MyChart) OR A paper copy in the mail If you have any lab test that is abnormal or we need to change your treatment, we will call you to review the results.   Follow-Up: At [facility], you and your health needs are our priority.  As part of our continuing mission to provide you with exceptional heart care, our providers are all part of one team.  This team includes your primary Cardiologist (physician) and Advanced Practice Providers or APPs (Physician Assistants and Nurse Practitioners) who all work together to provide you with the care you need, when you need it.  Your next appointment:   1 month(s)  Provider:   Joelle VEAR Ren Donley, MD    We recommend signing up for the patient portal called MyChart.  Sign up information is provided on this After Visit Summary.  MyChart is used to connect with patients for Virtual Visits (Telemedicine).  Patients are able to view lab/test results, encounter notes, upcoming appointments, etc.  Non-urgent messages can be sent to your provider as well.   To learn more about what you can do with MyChart, go to [email or web address].   Other Instructions Monitor your blood pressure and bring readings to your next appointment  We need to get a better idea of what your blood pressure is running at home. Here are some instructions to follow: - I would recommend using a blood pressure cuff that goes on your arm. The wrist ones can be inaccurate. If you're purchasing one for the first  time, try to select one that also reports your heart rate because this can be helpful information as well. - To check your blood pressure, choose a time at least 3 hours after taking your blood pressure medicines. If you can sample it at different times of the day, that's great - it might give you more information about how your blood pressure fluctuates. Remain seated in a chair for 5 minutes quietly beforehand, then check it.  - Please record a list of those readings and call us /send in MyChart message

## 2024-09-26 NOTE — Telephone Encounter (Signed)
 Noted.

## 2024-09-26 NOTE — Telephone Encounter (Signed)
 I spoke to the patient and let him Dr. Greggory would like him to get the sinus CT prior to scheduling surgery.  I gave him the phone number to [facility] Radiology.  He will call us  back once this is completed.

## 2024-09-26 NOTE — Telephone Encounter (Signed)
 FYI Only or Action Required?: FYI only for provider: appointment scheduled on 09/26/2024 at 3pm with Dr Theophilus.  Patient is followed in Pulmonology for Dyspnea on Exertion, last seen on 10/04/2023 by Valri Lamarr LABOR, NP.  Called Nurse Triage reporting Chest Pain.  Symptoms began several days ago.  Interventions attempted: OTC medications: Motrin .  Symptoms are: unchanged.  Triage Disposition: See Physician Within 24 Hours  Patient/caregiver understands and will follow disposition?: Yes                 Message from [facility] sent at 09/26/2024 12:37 PM EST  Reason for Triage: Chest pain, back pain   Reason for Disposition  [1] Chest pain lasts < 5 minutes AND [2] NO chest pain or cardiac symptoms (e.g., breathing difficulty, sweating) now  (Exception: Chest pains that last only a few seconds.)  Answer Assessment - Initial Assessment Questions Patient called and states that he was diagnosed with early emphysema   Patient also states that he has been having off and on upper right sided chest tightness for 2-3 days Patient denies any injuries, fevers, vomiting,difficulty breathing, coughing, coughing up blood, history of blood clots, recent major surgeries,   Denies any pain or tightness during triage Patient states right upper back having some discomfort as well if he moves his right shoulder a certain way  Patient states he took some motrin  for his back a few days  Back pain x 4 days---middle/upper right side of back ---worse with certain movements or palpation  Patient had an appointment with cardiology this morning and he states that they changed his medications for his blood pressure. Patient states his oxygen saturation was 96% on room air  Patient is advised to call us  back if anything changes or with any further questions/concerns. Patient is advised that if anything worsens to go to the Emergency Room or call 911. Patient verbalized  understanding.  Protocols used: Chest Pain-A-AH

## 2024-09-26 NOTE — Patient Instructions (Signed)
 VISIT SUMMARY:  During your visit, we addressed your back pain, emphysema, and pulmonary nodules. We also discussed your recent smoking cessation and its importance for your lung health.  YOUR PLAN:  MUSCULOSKELETAL CHEST AND BACK PAIN: You have intermittent chest and back pain, likely due to musculoskeletal issues, which is worsened by certain positions and movements. -We ordered a chest x-ray and a D-dimer test to rule out other causes such as a pulmonary embolism. -You can use over-the-counter pain relief like Tylenol  or Advil . -Consider using over-the-counter lidocaine  patches or Salonpas for pain management.  EMPHYSEMA: Smoking cessation is crucial to prevent the progression of emphysema. -We ordered lung function tests to assess the severity of your emphysema. -Continue to abstain from smoking and avoid exposure to tobacco smoke.  PULMONARY NODULES: Your pulmonary nodules are stable with no new concerning features on recent imaging. -Continue with annual screening CT scans to monitor the pulmonary nodules.  NICOTINE DEPENDENCE: You have a long history of smoking but recently quit, which is very beneficial for your lung health. -Continue to stay smoke-free. -We discussed the risks of smoking and the benefits of cessation.

## 2024-09-26 NOTE — Progress Notes (Signed)
 "              Martin [name]    [id number]    [birth year]  Primary Care Physician:Hill, Elna, MD  Referring Physician: Leigh Elna, MD [address] [place],  [place] [address]  Chief complaint: Acute visit for atypical chest pain  HPI: 54 y.o. who  has a past medical history of HSV (herpes simplex virus) infection, Hyperlipidemia, Hypertension, and Urticaria.  Discussed the use of AI scribe software for clinical note transcription with the patient, who gave verbal consent to proceed.  History of Present Illness The patient, with emphysema and pulmonary nodules, presents with back pain and concerns about emphysema.  He is a patient of Dr. Annella  Back pain and chest pressure - Back pain onset since Saturday - Pain described as a catch under the right shoulder - Pain worsens with lying down, turning, or sitting with pressure - Occasional radiation of pain to the left side - Lying on his side can cause sensation of chest pressure - Uncertain if pain is related to pulmonary condition  Dyspnea - Mild shortness of breath present - No significant change from baseline breathing pattern  Tobacco use history - Smoked for over 25 years at a rate of half to three-quarters of a pack per day - Quit smoking last week  Pulmonary nodules and emphysema surveillance - Cardiac CT on December 18 showed emphysema and stable pulmonary nodules - Undergoes annual CT scans for nodule monitoring  Cardiac history - Under care of cardiologist for heart issues including premature ventricular contractions (PVCs) - EKG performed today showed normal sinus rhythm with no acute ST-T abnormalities - Cardiac CT performed in December 2025 did not show any coronary artery disease    Outpatient Encounter Medications as of 09/26/2024  Medication Sig   Blood Pressure Monitoring (OMRON 3 SERIES BP MONITOR) DEVI Check blood pressure twice daily   carvedilol  (COREG ) 12.5 MG tablet Take 1 tablet (12.5 mg  total) by mouth 2 (two) times daily.   fluticasone  (FLONASE ) 50 MCG/ACT nasal spray Place 2 sprays into both nostrils daily.   loratadine (CLARITIN) 10 MG tablet Take 10 mg by mouth daily.   mexiletine (MEXITIL) 250 MG capsule Take 1 capsule (250 mg total) by mouth 2 (two) times daily.   sacubitril -valsartan  (ENTRESTO ) 97-103 MG Take 1 tablet by mouth 2 (two) times daily.   spironolactone  (ALDACTONE ) 25 MG tablet Take 1 tablet (25 mg total) by mouth daily.   valACYclovir  (VALTREX ) 1000 MG tablet Take 1,000 mg by mouth daily. Continuously   empagliflozin  (JARDIANCE ) 10 MG TABS tablet Take 1 tablet (10 mg total) by mouth daily before breakfast. (Patient not taking: Reported on 09/26/2024)   levocetirizine (XYZAL ) 5 MG tablet Take 1 tablet (5 mg total) by mouth every evening. (Patient not taking: Reported on 09/26/2024)   No facility-administered encounter medications on file as of 09/26/2024.     Physical Exam: Today's Vitals   09/26/24 1500  BP: (!) 142/83  Pulse: 83  TempSrc: Oral  SpO2: 95%  Weight: 272 lb (123.4 kg)  Height: 6' (1.829 m)   Body mass index is 36.89 kg/m.  Physical Exam     Data Reviewed: Imaging: Cardiac CT 08/24/2024-stable pulm nodules, mild emphysema.  Scattered areas of parenchymal scarring.  Chronic eventration of right hemidiaphragm I reviewed the images personally  PFTs: 09/16/2022 FVC 3.29 [61%], FEV1 2.76 [6 6%], F/F84, TLC 5.52 [74%], DLCO 27.71 [89%] Mild restriction  Labs:  Assessment &  Plan Musculoskeletal chest and back pain Intermittent chest and back pain, likely musculoskeletal in origin, exacerbated by certain positions and movements. Differential includes musculoskeletal pain versus other causes such as pulmonary or cardiac issues. Recent EKG was normal, and cardiac CT showed no significant findings. Pain is not associated with shortness of breath or other cardiac symptoms. - Ordered chest x-ray - Ordered D-dimer test to rule out  pulmonary embolism - Advised use of over-the-counter pain relief such as Tylenol  or Advil  - Recommended over-the-counter lidocaine  patches or Salonpas for pain management  Emphysema Smoking cessation is crucial to prevent progression. Lung function tests are needed to assess the severity and progression of emphysema. - Ordered lung function tests to assess severity of emphysema - Advised smoking cessation and encouraged continued abstinence from smoking  Pulmonary nodules Stable pulmonary nodules noted on previous CT scans. No new concerning features on recent imaging. - Continue annual screening CT scans to monitor pulmonary nodules  Nicotine dependence Long-standing nicotine dependence with recent cessation of smoking. Smoking history of approximately 25 years, with recent reduction to less than a pack per day. Smoking cessation is critical for lung health and overall well-being. - Encouraged continued smoking cessation.  Time spent counseling-5 minutes.  Reassess at return visit - Discussed risks of smoking and benefits of cessation  Recommendations: Chest x-ray, D-dimer PFTs Smoking cessation Continue annual CTs  Shellia Hartl MD [facility] 09/26/2024, 3:06 PM  CC: Leigh Lung, MD   "

## 2024-09-27 LAB — D-DIMER, QUANTITATIVE: D-Dimer, Quant: 0.19 ug{FEU}/mL

## 2024-09-28 ENCOUNTER — Ambulatory Visit: Payer: Self-pay | Admitting: Pulmonary Disease

## 2024-09-29 ENCOUNTER — Ambulatory Visit (HOSPITAL_COMMUNITY): Admission: RE | Admit: 2024-09-29 | Discharge: 2024-09-29 | Disposition: A | Source: Ambulatory Visit

## 2024-09-29 DIAGNOSIS — J3489 Other specified disorders of nose and nasal sinuses: Secondary | ICD-10-CM | POA: Diagnosis present

## 2024-09-29 DIAGNOSIS — J328 Other chronic sinusitis: Secondary | ICD-10-CM | POA: Diagnosis present

## 2024-10-03 LAB — LIPID PANEL
Chol/HDL Ratio: 3.4 ratio (ref 0.0–5.0)
Cholesterol, Total: 181 mg/dL (ref 100–199)
HDL: 53 mg/dL
LDL Chol Calc (NIH): 115 mg/dL — ABNORMAL HIGH (ref 0–99)
Triglycerides: 72 mg/dL (ref 0–149)
VLDL Cholesterol Cal: 13 mg/dL (ref 5–40)

## 2024-10-03 LAB — ALDOSTERONE + RENIN ACTIVITY W/ RATIO
Aldos/Renin Ratio: 46.4 — ABNORMAL HIGH (ref 0.0–20.0)
Aldosterone: 12.4 ng/dL (ref 0.0–30.0)
Renin Activity, Plasma: 0.267 ng/mL/h (ref 0.167–5.380)

## 2024-10-04 ENCOUNTER — Telehealth (INDEPENDENT_AMBULATORY_CARE_PROVIDER_SITE_OTHER): Payer: Self-pay

## 2024-10-04 NOTE — Telephone Encounter (Signed)
 Called patient and explained to them that Dr. Greggory was out of the office this week and would be back about Wednesday of next week. Patient understood. We got the patient an appointment with Dr. Greggory to go over his Imaging and talk about surgery. Patient agreed to the date and time but asked to be called if there is a sooner appointment available.

## 2024-10-11 ENCOUNTER — Ambulatory Visit

## 2024-10-11 VITALS — BP 140/100 | HR 88 | Ht 72.0 in | Wt 272.0 lb

## 2024-10-11 DIAGNOSIS — I10 Essential (primary) hypertension: Secondary | ICD-10-CM

## 2024-10-11 DIAGNOSIS — E785 Hyperlipidemia, unspecified: Secondary | ICD-10-CM

## 2024-10-11 DIAGNOSIS — I5022 Chronic systolic (congestive) heart failure: Secondary | ICD-10-CM

## 2024-10-11 DIAGNOSIS — Z87891 Personal history of nicotine dependence: Secondary | ICD-10-CM | POA: Diagnosis not present

## 2024-10-11 DIAGNOSIS — G4733 Obstructive sleep apnea (adult) (pediatric): Secondary | ICD-10-CM | POA: Diagnosis not present

## 2024-10-11 DIAGNOSIS — I493 Ventricular premature depolarization: Secondary | ICD-10-CM

## 2024-10-11 MED ORDER — LOSARTAN POTASSIUM 50 MG PO TABS: 50.0000 mg | ORAL_TABLET | Freq: Every day | ORAL | 1 refills | Status: AC

## 2024-10-11 MED ORDER — AMLODIPINE BESYLATE 10 MG PO TABS: 10.0000 mg | ORAL_TABLET | Freq: Every day | ORAL | 1 refills | Status: AC

## 2024-10-11 NOTE — Patient Instructions (Signed)
 Medication Instructions:  STOP Entresto  (sacubitril -valsartan ) STOP Spironolactone  START Norvasc  (Amlodipine ) 10mg  Take 1 tablet once a day  START Losartan  (cozaar ) 50mg  Take 1 tablet once a day  *If you need a refill on your cardiac medications before your next appointment, please call your pharmacy*  Lab Work: None ordered If you have labs (blood work) drawn today and your tests are completely normal, you will receive your results only by: MyChart Message (if you have MyChart) OR A paper copy in the mail If you have any lab test that is abnormal or we need to change your treatment, we will call you to review the results.  Testing/Procedures: None ordered  Follow-Up: At [facility], you and your health needs are our priority.  As part of our continuing mission to provide you with exceptional heart care, our providers are all part of one team.  This team includes your primary Cardiologist (physician) and Advanced Practice Providers or APPs (Physician Assistants and Nurse Practitioners) who all work together to provide you with the care you need, when you need it.  Your next appointment:   2 week(s)  Provider:   Joelle VEAR Ren Donley, MD    We recommend signing up for the patient portal called MyChart.  Sign up information is provided on this After Visit Summary.  MyChart is used to connect with patients for Virtual Visits (Telemedicine).  Patients are able to view lab/test results, encounter notes, upcoming appointments, etc.  Non-urgent messages can be sent to your provider as well.   To learn more about what you can do with MyChart, go to [email or web address].   Other Instructions

## 2024-10-12 NOTE — Progress Notes (Unsigned)
" °   °  °  Cardiology Office Note Date:  10/12/2024  ID:  BO TEICHER, DOB 04-10-1971, MRN [id number] PCP:  Leigh Lung, MD  Cardiologist:  Joelle VEAR Ren Donley, MD  No chief complaint on file.     Problems LV dysfunction/NICM CCTA 12/25: CAC 0 cMR 11/25: EF 35%, no LGE High PVC burden ETT 1/24- normal per report TTE 1/24: 60-65%, mod LVH, dilated LV EM 12/25: 20% PVC burden OSA not on CPAP M: CL12.5, EN10, ME250BID, SV 97-103, SE25   Visits  11/25 w/ Dayna Dunn: PVCs --> Toprol  25, 14 day monitor, cMR for LVH 11/25 ED: High BP (190), dyspnea, and lightheadedness 12/25: switched O-HTZ to Entresto  97-103 (labs including LP), coronary CTA given low EF, EP referral; awaiting EM 12:25: Remind to do lipid panel, increased XL to 50, EN10, SE25, Labs in 2 weeks, EM pending EP 12/25: started mexiletine and plan to repeat monitor; patient preferred meds to ablation; metop to coreg  due to BP Clinical Support: ECG w/o PVCs, had not picked up Jardiance  and unclear whether patient is taking spironolactone  1/26: LP, repeat renin aldo, follow up in 1 month for TTE 2/26: d/c SV and SE; add AE and LN 2/26:       ROS: Otherwise negative Discussed the use of AI scribe software for clinical note transcription with the patient, who gave verbal consent to proceed.  History of Present Illness     Physical Exam VS:  There were no vitals taken for this visit. , BMI There is no height or weight on file to calculate BMI. GEN: Well nourished, well developed, in no acute distress HEENT: normal Neck: no JVD, carotid bruits, or masses Cardiac: ***RRR; no murmurs, rubs, or gallops,no edema  Respiratory:  CTAB bilaterally, normal work of breathing GI: soft, nontender, nondistended, + BS Extremities: No LE edema Skin: warm and dry, no rash Neuro:  Strength and sensation are intact  Recent Labs: Reviewed Assessment & Plan      Signed, Joelle VEAR Ren Donley, MD  10/12/2024 1:27 PM    [facility] "

## 2024-10-13 ENCOUNTER — Encounter (HOSPITAL_BASED_OUTPATIENT_CLINIC_OR_DEPARTMENT_OTHER): Payer: Self-pay | Admitting: Pulmonary Disease

## 2024-10-13 DIAGNOSIS — G4733 Obstructive sleep apnea (adult) (pediatric): Secondary | ICD-10-CM

## 2024-10-20 ENCOUNTER — Ambulatory Visit (INDEPENDENT_AMBULATORY_CARE_PROVIDER_SITE_OTHER)

## 2024-10-26 ENCOUNTER — Ambulatory Visit

## 2024-10-26 DIAGNOSIS — I10 Essential (primary) hypertension: Secondary | ICD-10-CM

## 2024-10-26 DIAGNOSIS — E785 Hyperlipidemia, unspecified: Secondary | ICD-10-CM

## 2024-10-26 DIAGNOSIS — I493 Ventricular premature depolarization: Secondary | ICD-10-CM

## 2024-10-26 DIAGNOSIS — I5022 Chronic systolic (congestive) heart failure: Secondary | ICD-10-CM

## 2024-10-26 DIAGNOSIS — G4733 Obstructive sleep apnea (adult) (pediatric): Secondary | ICD-10-CM

## 2024-10-26 DIAGNOSIS — Z87891 Personal history of nicotine dependence: Secondary | ICD-10-CM

## 2024-11-23 ENCOUNTER — Ambulatory Visit: Admitting: Physician Assistant

## 2024-12-05 ENCOUNTER — Ambulatory Visit: Admitting: Pulmonary Disease

## 2024-12-27 ENCOUNTER — Ambulatory Visit: Admitting: Pulmonary Disease

## 2024-12-27 ENCOUNTER — Encounter
